# Patient Record
Sex: Male | Born: 1980
Health system: Southern US, Community
[De-identification: ages and names within clinical notes are randomized; demographics above are authoritative.]

## PROBLEM LIST (undated history)

## (undated) DIAGNOSIS — R1310 Dysphagia, unspecified: Secondary | ICD-10-CM

## (undated) DIAGNOSIS — R319 Hematuria, unspecified: Secondary | ICD-10-CM

## (undated) DIAGNOSIS — H02402 Unspecified ptosis of left eyelid: Secondary | ICD-10-CM

## (undated) DIAGNOSIS — H5010 Unspecified exotropia: Secondary | ICD-10-CM

## (undated) DIAGNOSIS — R0602 Shortness of breath: Secondary | ICD-10-CM

## (undated) DIAGNOSIS — I10 Essential (primary) hypertension: Secondary | ICD-10-CM

## (undated) DIAGNOSIS — D75839 Thrombocytosis, unspecified: Secondary | ICD-10-CM

## (undated) DIAGNOSIS — H532 Diplopia: Secondary | ICD-10-CM

## (undated) DIAGNOSIS — G7000 Myasthenia gravis without (acute) exacerbation: Secondary | ICD-10-CM

## (undated) HISTORY — DX: Shortness of breath: R06.02

## (undated) HISTORY — DX: Unspecified exotropia: H50.10

## (undated) HISTORY — DX: Myasthenia gravis without (acute) exacerbation: G70.00

## (undated) HISTORY — DX: Diplopia: H53.2

## (undated) HISTORY — DX: Hematuria, unspecified: R31.9

## (undated) HISTORY — DX: Dysphagia, unspecified: R13.10

## (undated) HISTORY — DX: Unspecified ptosis of left eyelid: H02.402

## (undated) HISTORY — PX: FRACTURE SURGERY: SHX138

## (undated) HISTORY — DX: Essential (primary) hypertension: I10

## (undated) HISTORY — DX: Thrombocytosis, unspecified: D75.839

---

## 2006-09-29 ENCOUNTER — Emergency Department (HOSPITAL_COMMUNITY): Admission: EM | Admit: 2006-09-29 | Discharge: 2006-09-29 | Payer: Self-pay | Admitting: Emergency Medicine

## 2006-09-29 IMAGING — CR DG SHOULDER 2+V*L*
3 series · 3 of 3 positions shown · non-contrast
Comparison: None available.

CLINICAL DATA: Fell yesterday ? posterior shoulder pain. 
 LEFT SHOULDER ? 3 VIEW:

[view not recorded (1 of 3)]
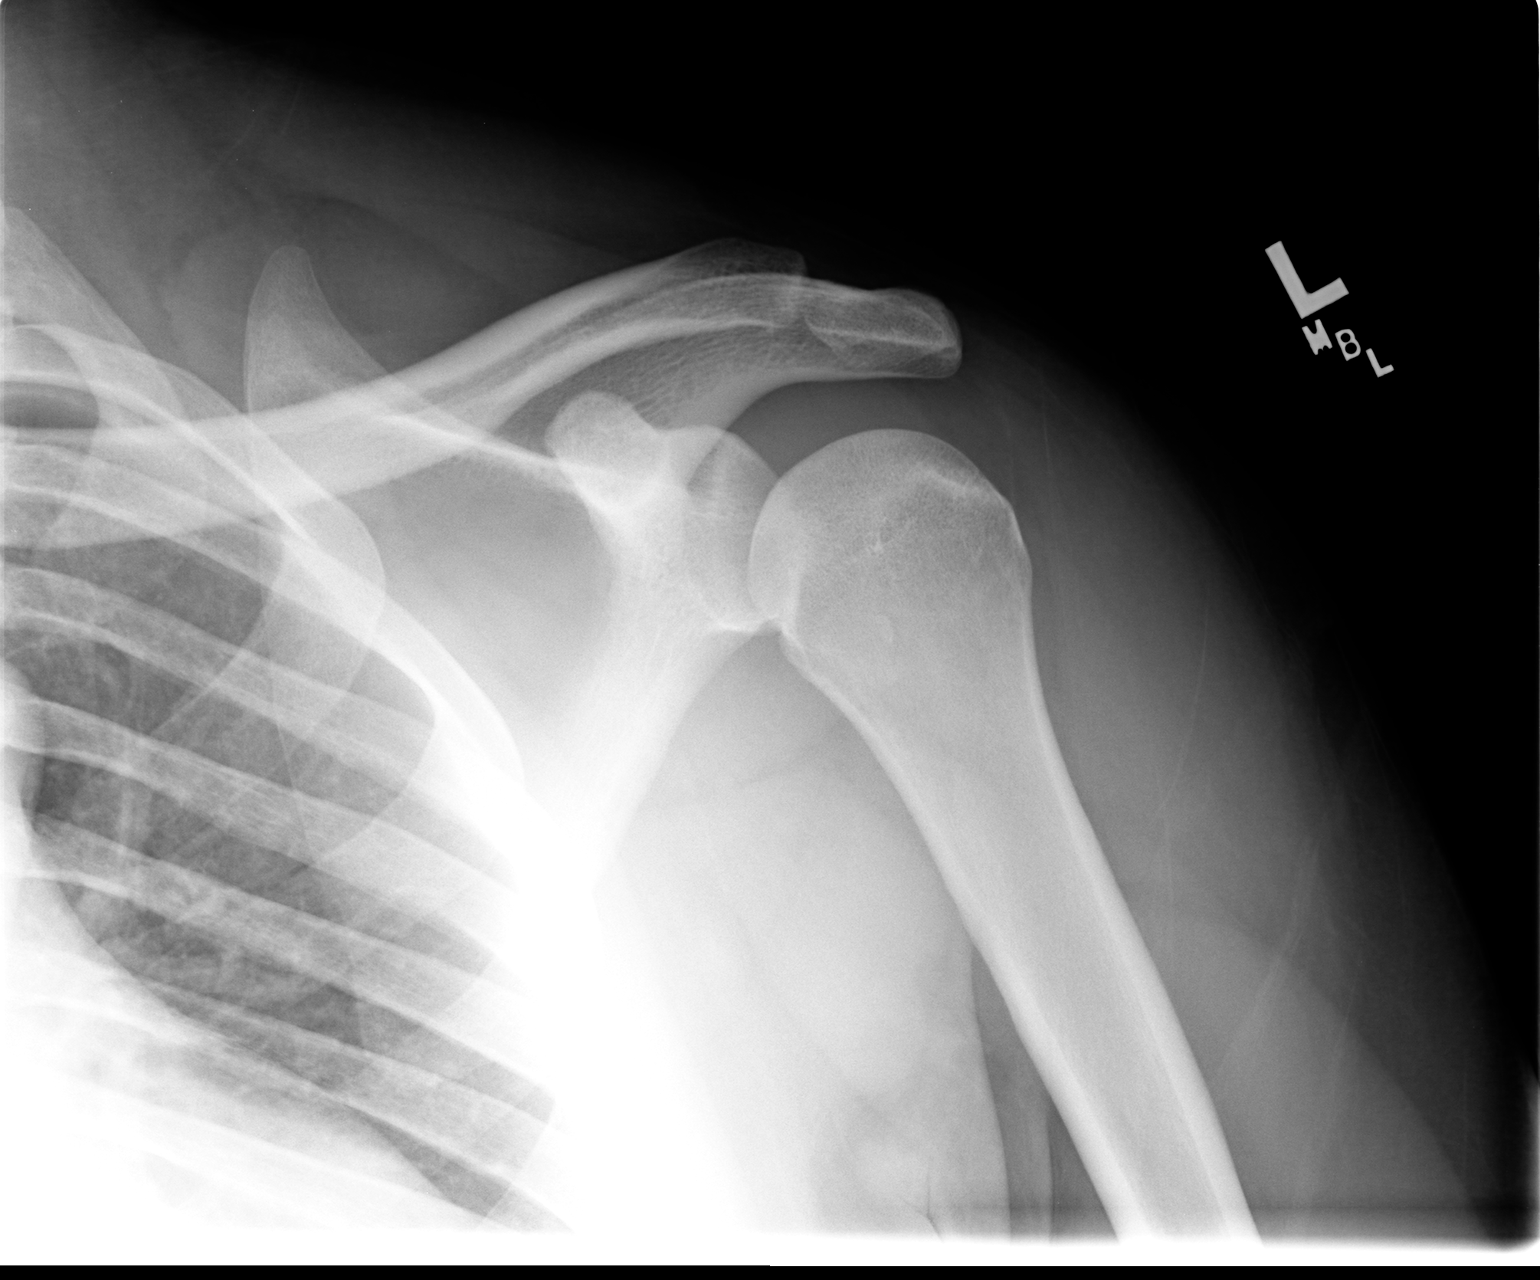

[view not recorded (2 of 3)]
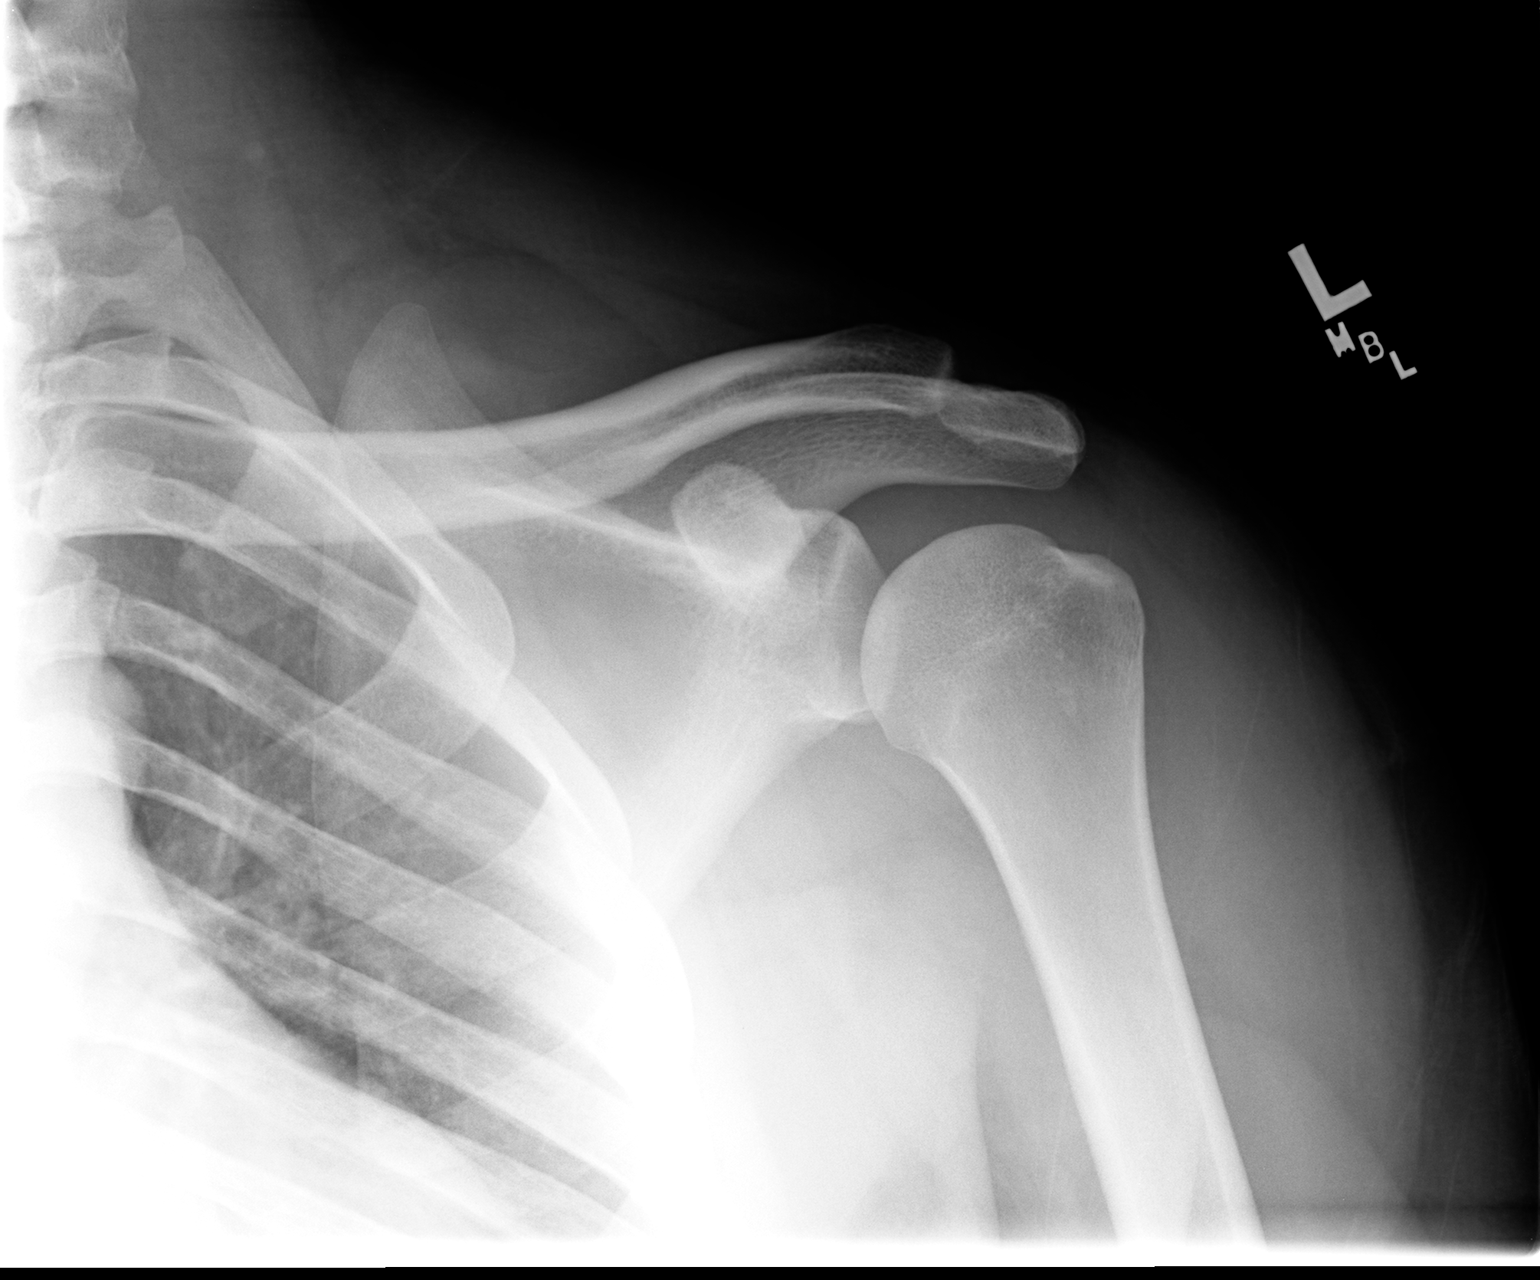

[view not recorded (3 of 3)]
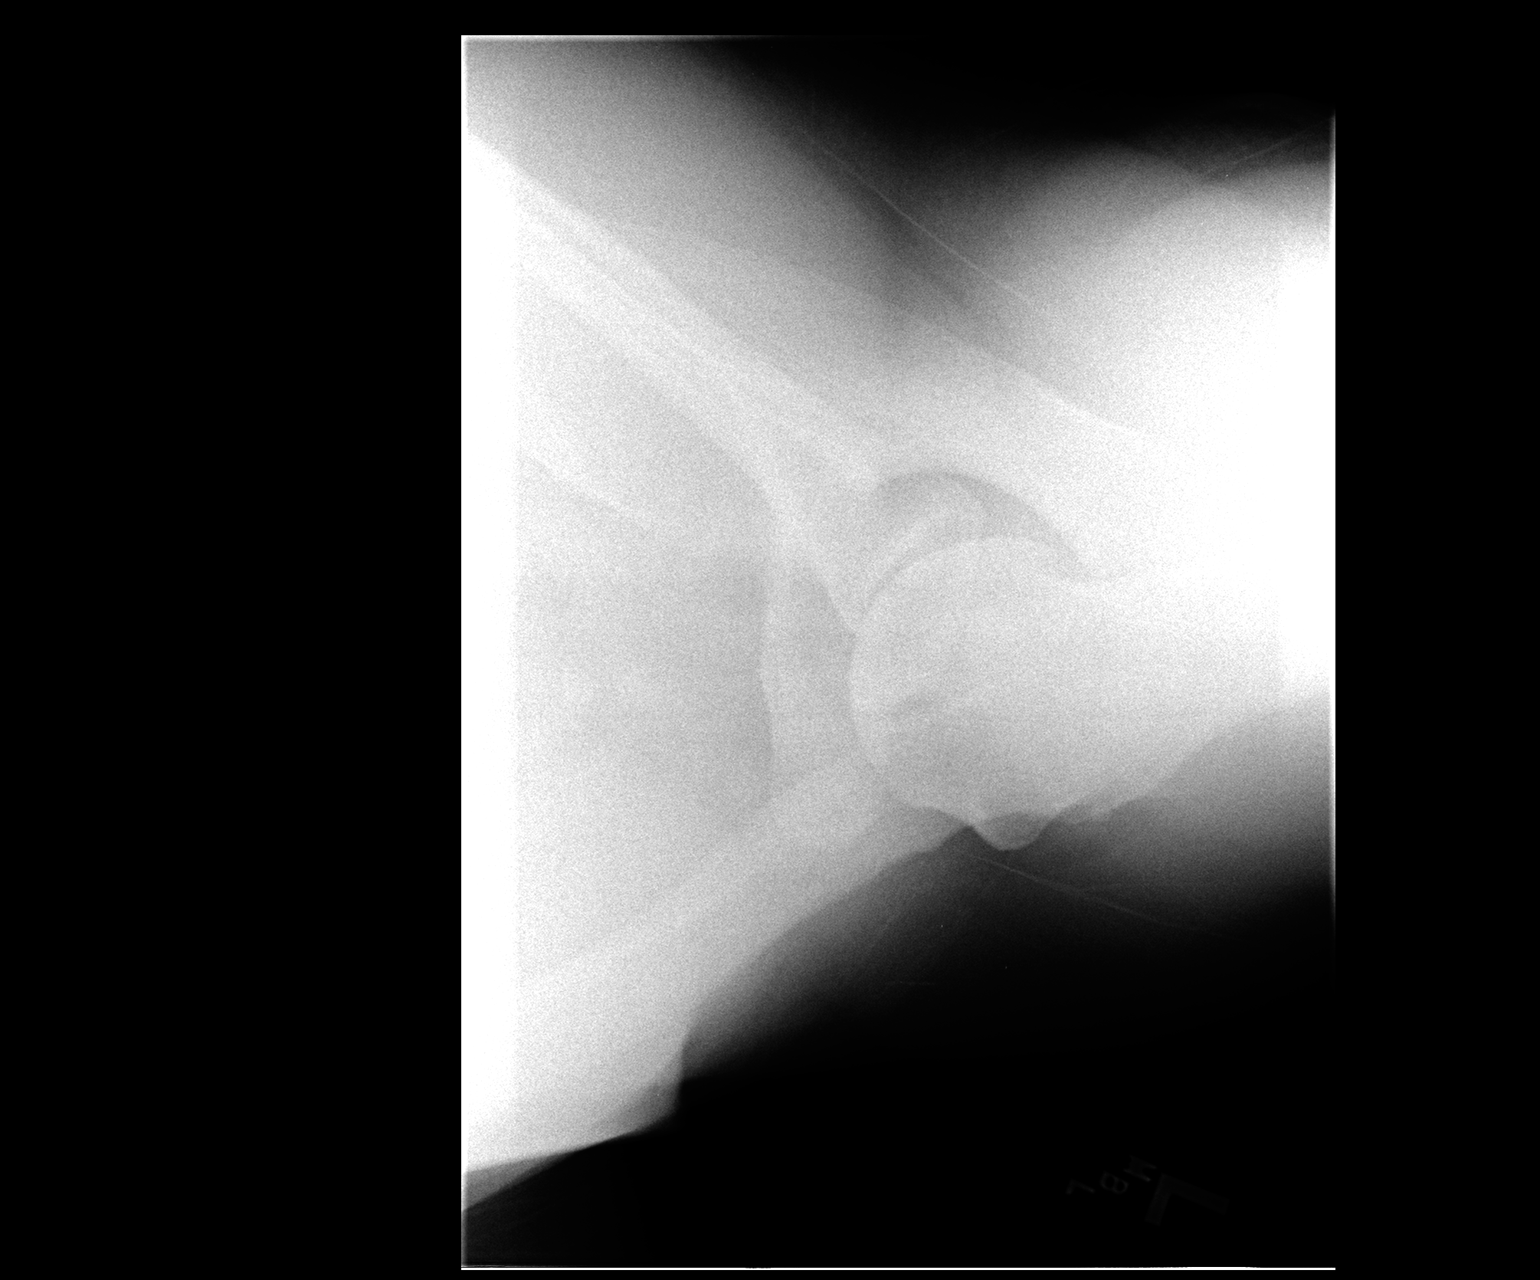

[3 of 3 positions shown; findings below may reference images not displayed]

FINDINGS: There is no definite fracture or dislocation.  However, the left distal clavicle is high relative to the acromion.  A left AC joint separation cannot be excluded.  This needs clinical correlation.  Recommend AC joint series if further evaluation is clinically warranted.
IMPRESSION: Cannot rule out left AC joint separation ? see report.

## 2006-09-29 IMAGING — CR DG HAND COMPLETE 3+V*L*
3 series · 3 of 3 positions shown · non-contrast
Comparison: None.

CLINICAL DATA: Fell - laceration to 3rd MCP joint area. 
 LEFT HAND - 3 VIEW:

[view not recorded (1 of 3)]
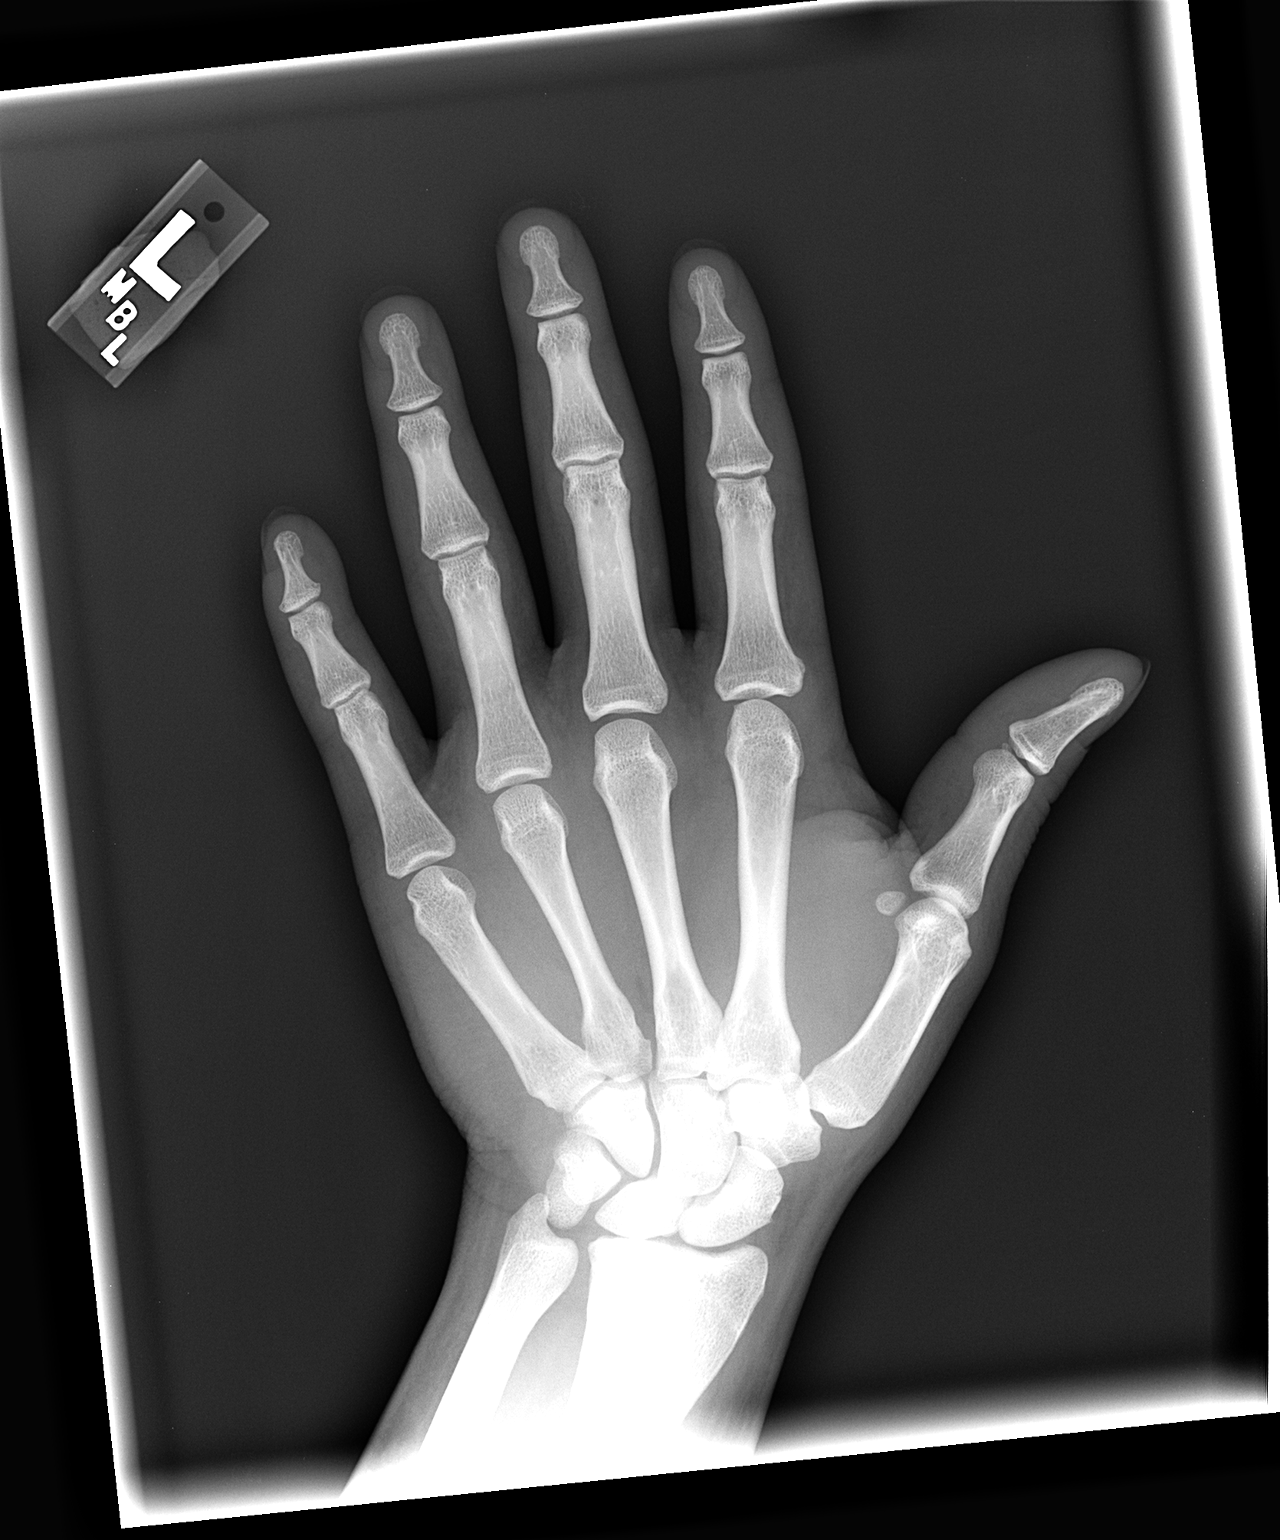

[view not recorded (2 of 3)]
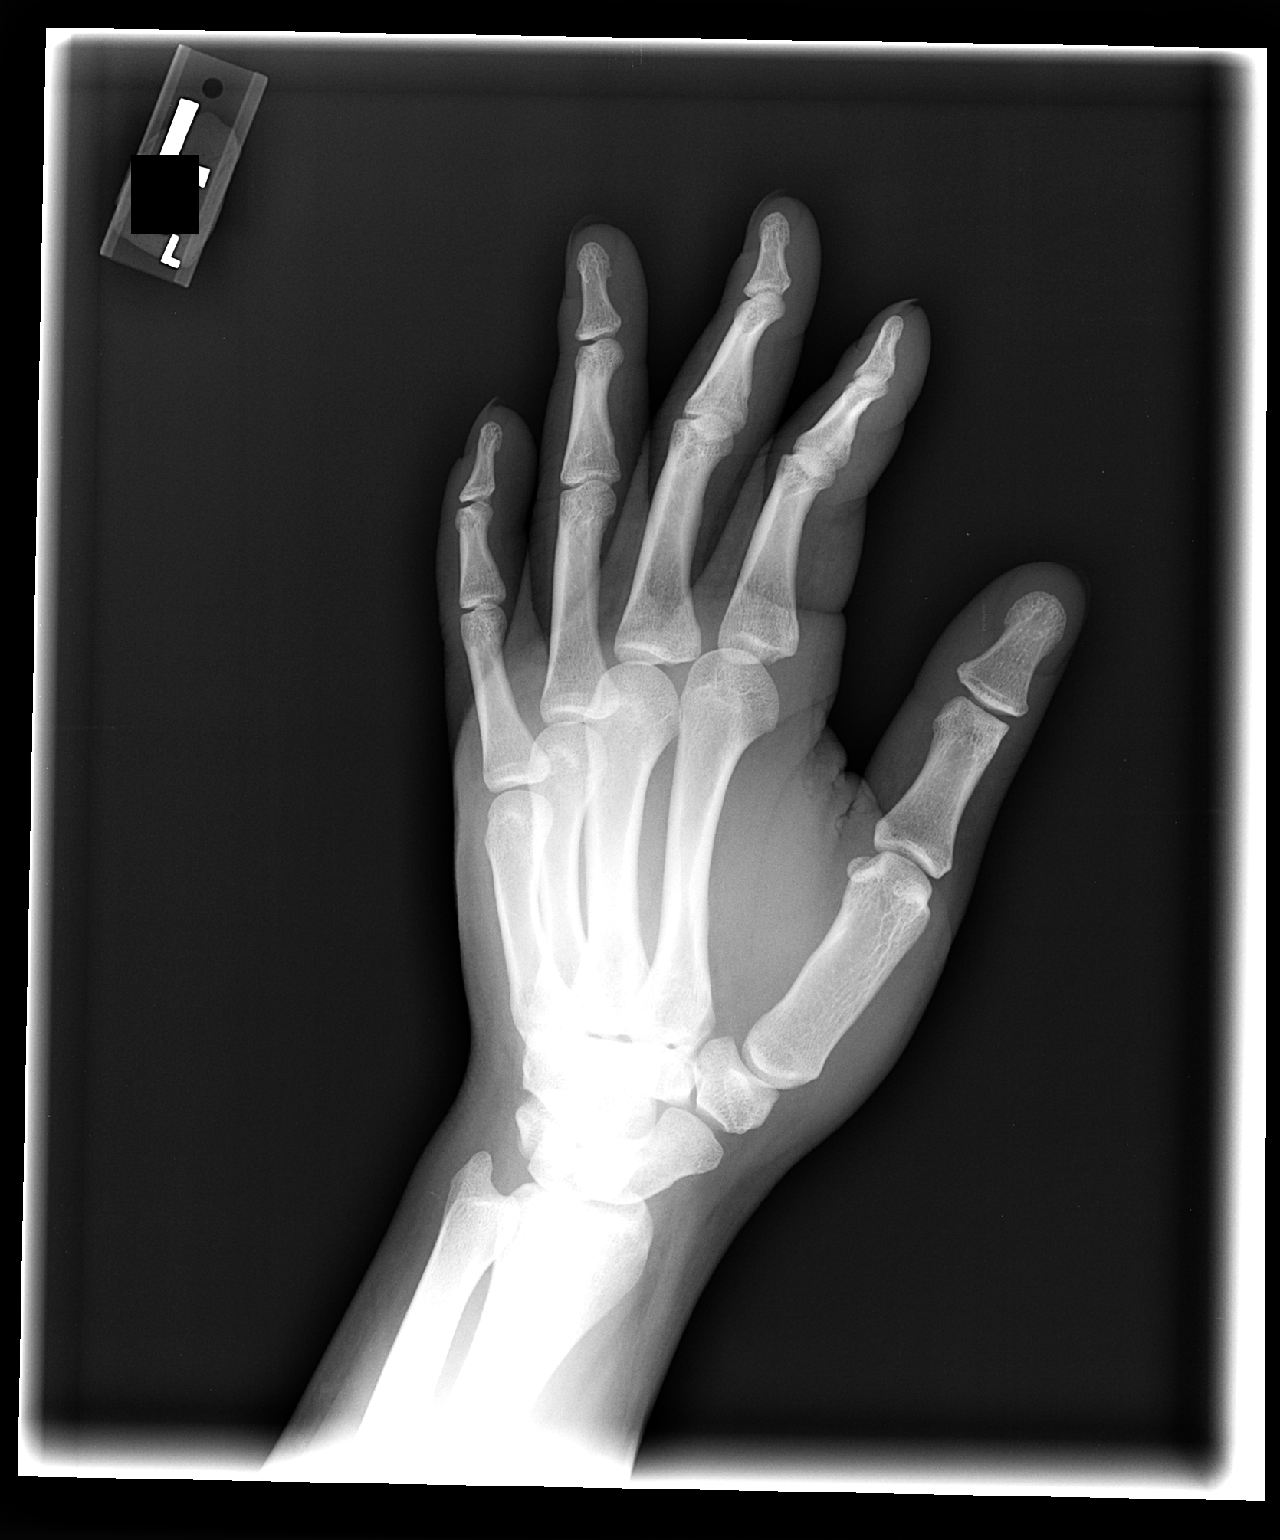

[view not recorded (3 of 3)]
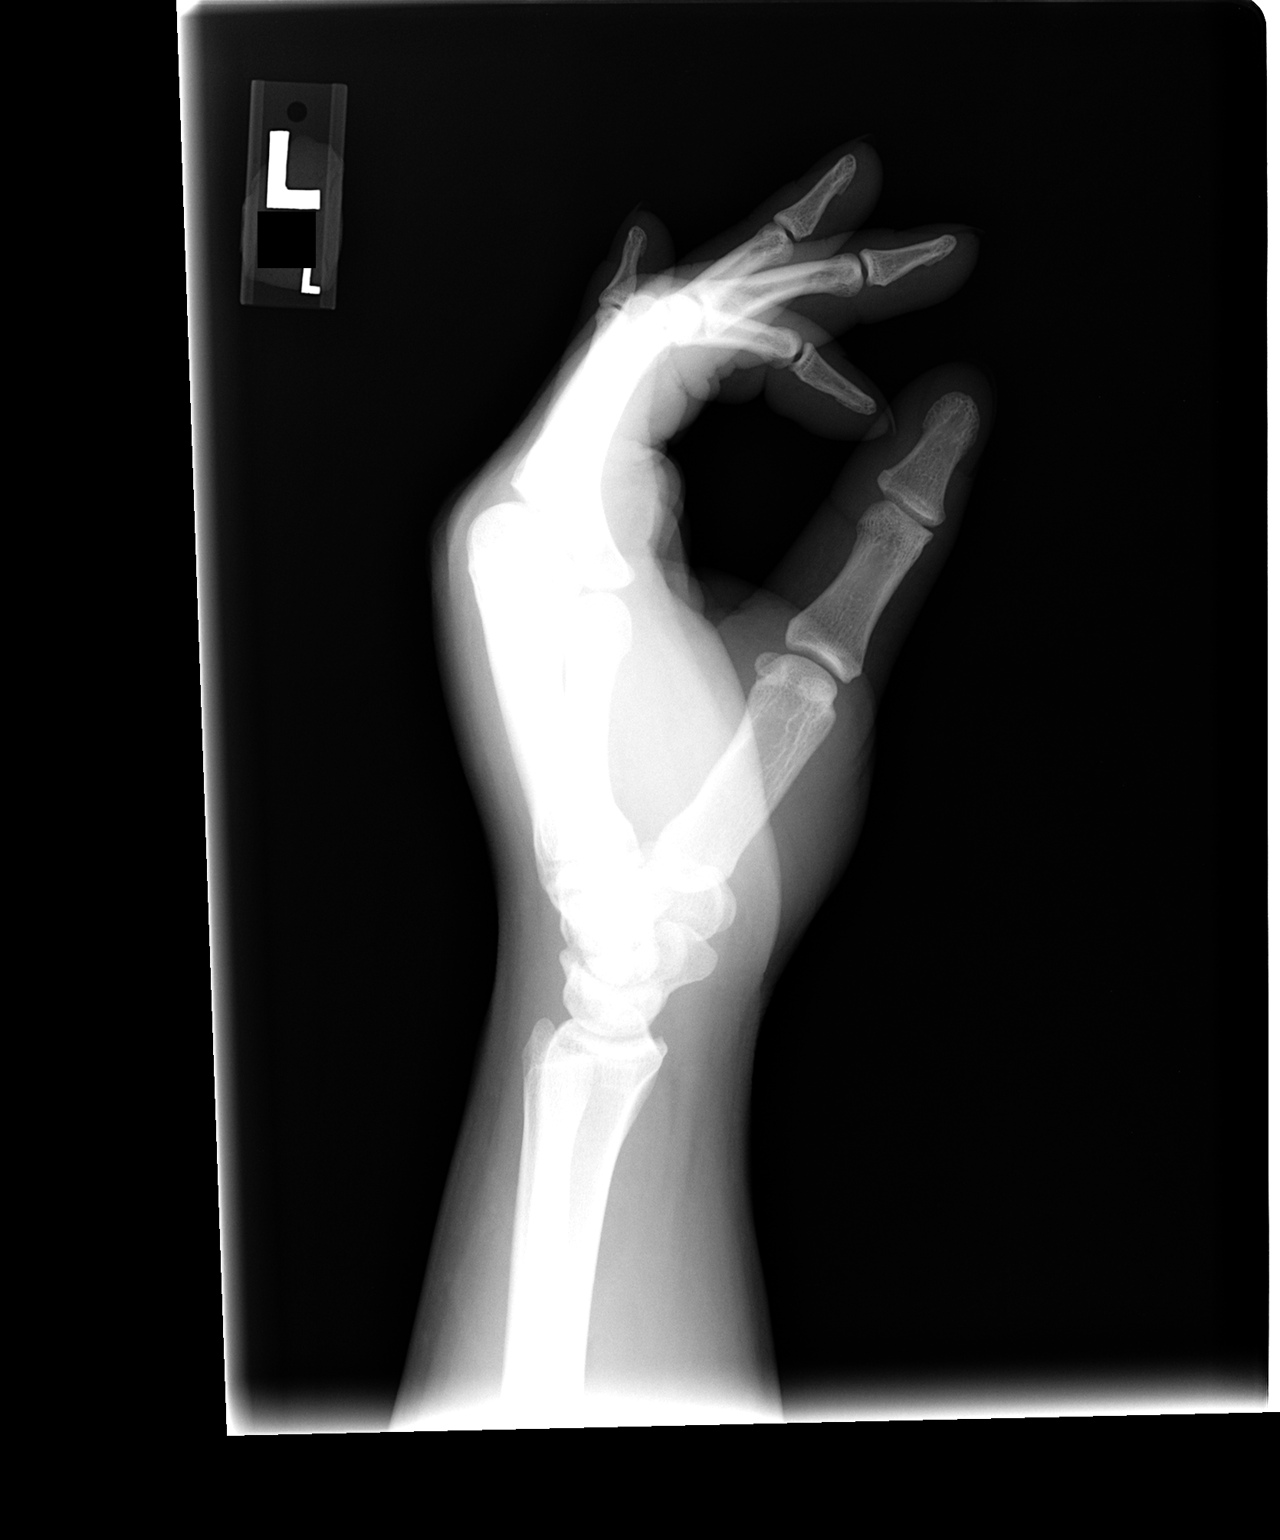

[3 of 3 positions shown; findings below may reference images not displayed]

FINDINGS: There is some soft tissue swelling over the dorsum of the hand.  No fracture, dislocation, or foreign body.
IMPRESSION: No acute or significant findings.

## 2007-08-03 ENCOUNTER — Emergency Department (HOSPITAL_COMMUNITY): Admission: EM | Admit: 2007-08-03 | Discharge: 2007-08-03 | Payer: Self-pay | Admitting: Family Medicine

## 2007-12-05 IMAGING — CR DG SACRUM/COCCYX 2+V
2 series · 2 of 2 positions shown · non-contrast
Comparison: None

CLINICAL DATA: Fall.  Sacral pain.

SACRUM AND COCCYX - 2+ VIEW

[t sacrum a.p.]
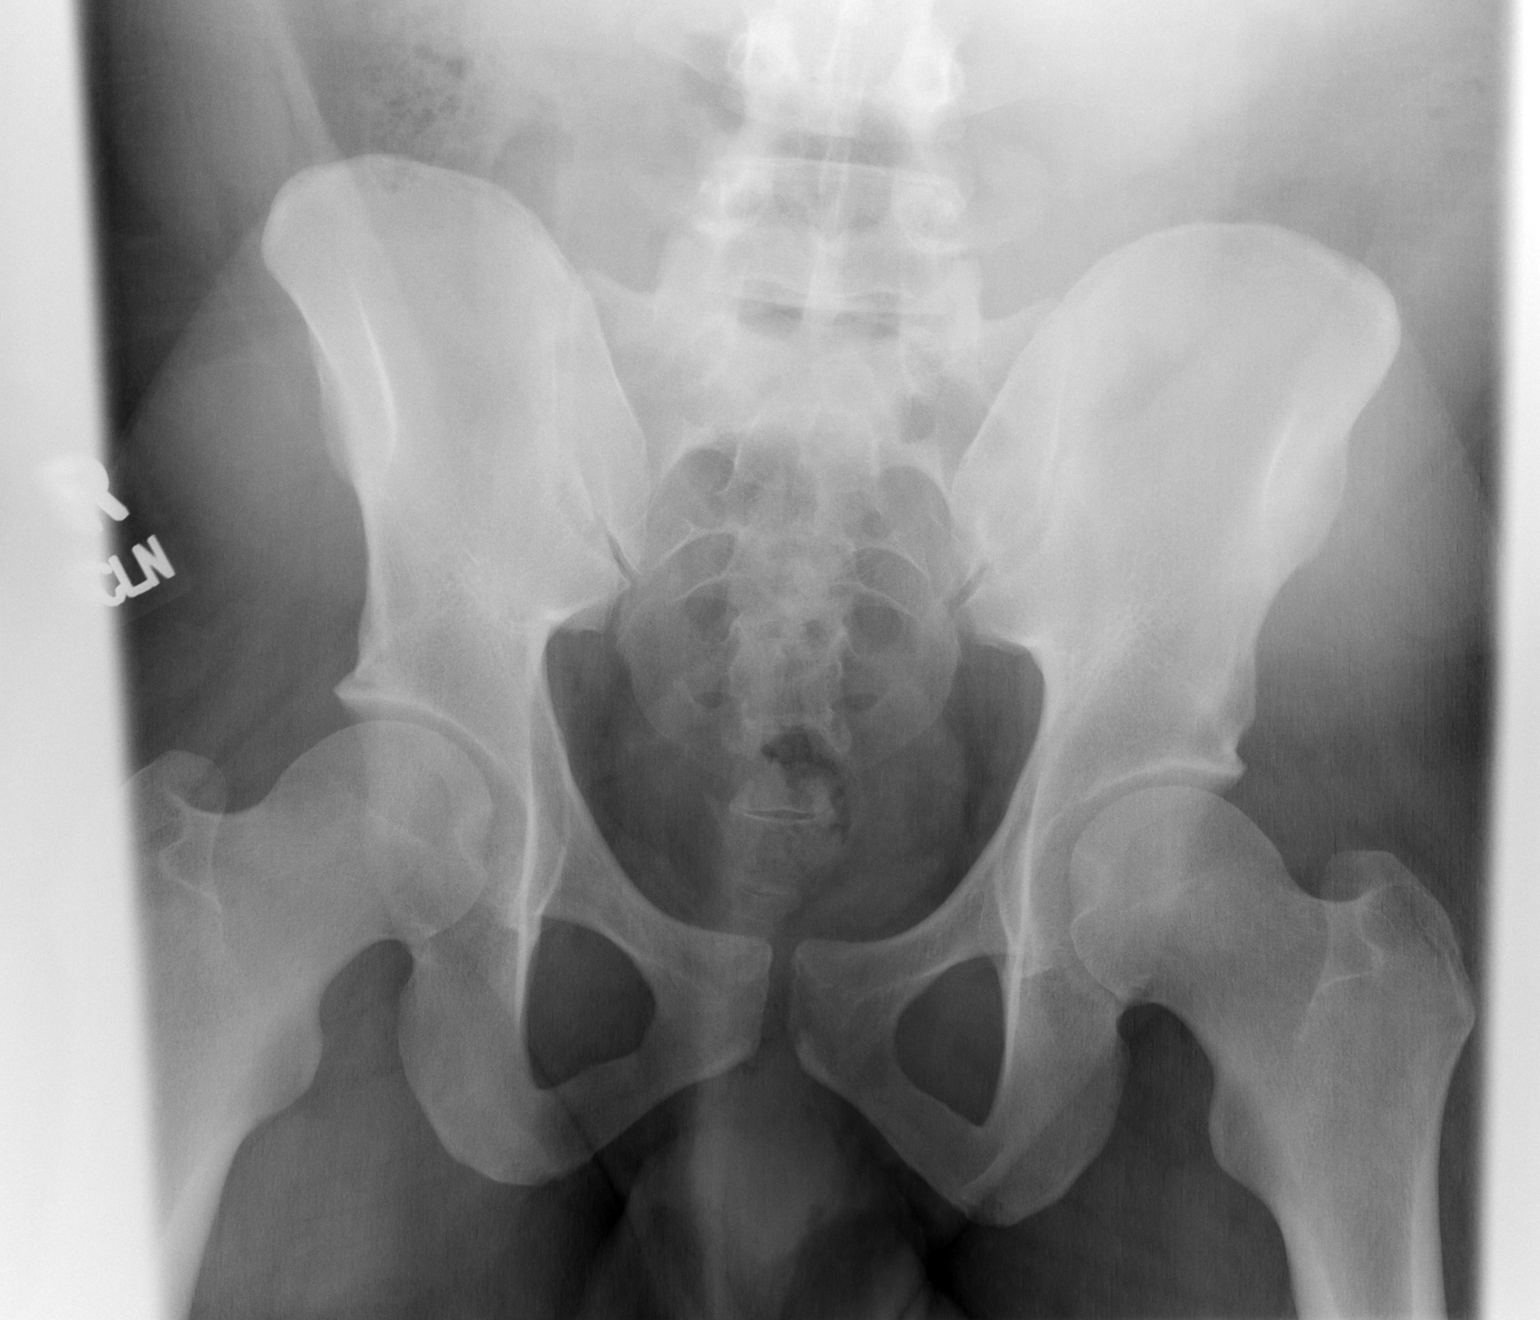

[t sacrum lat]
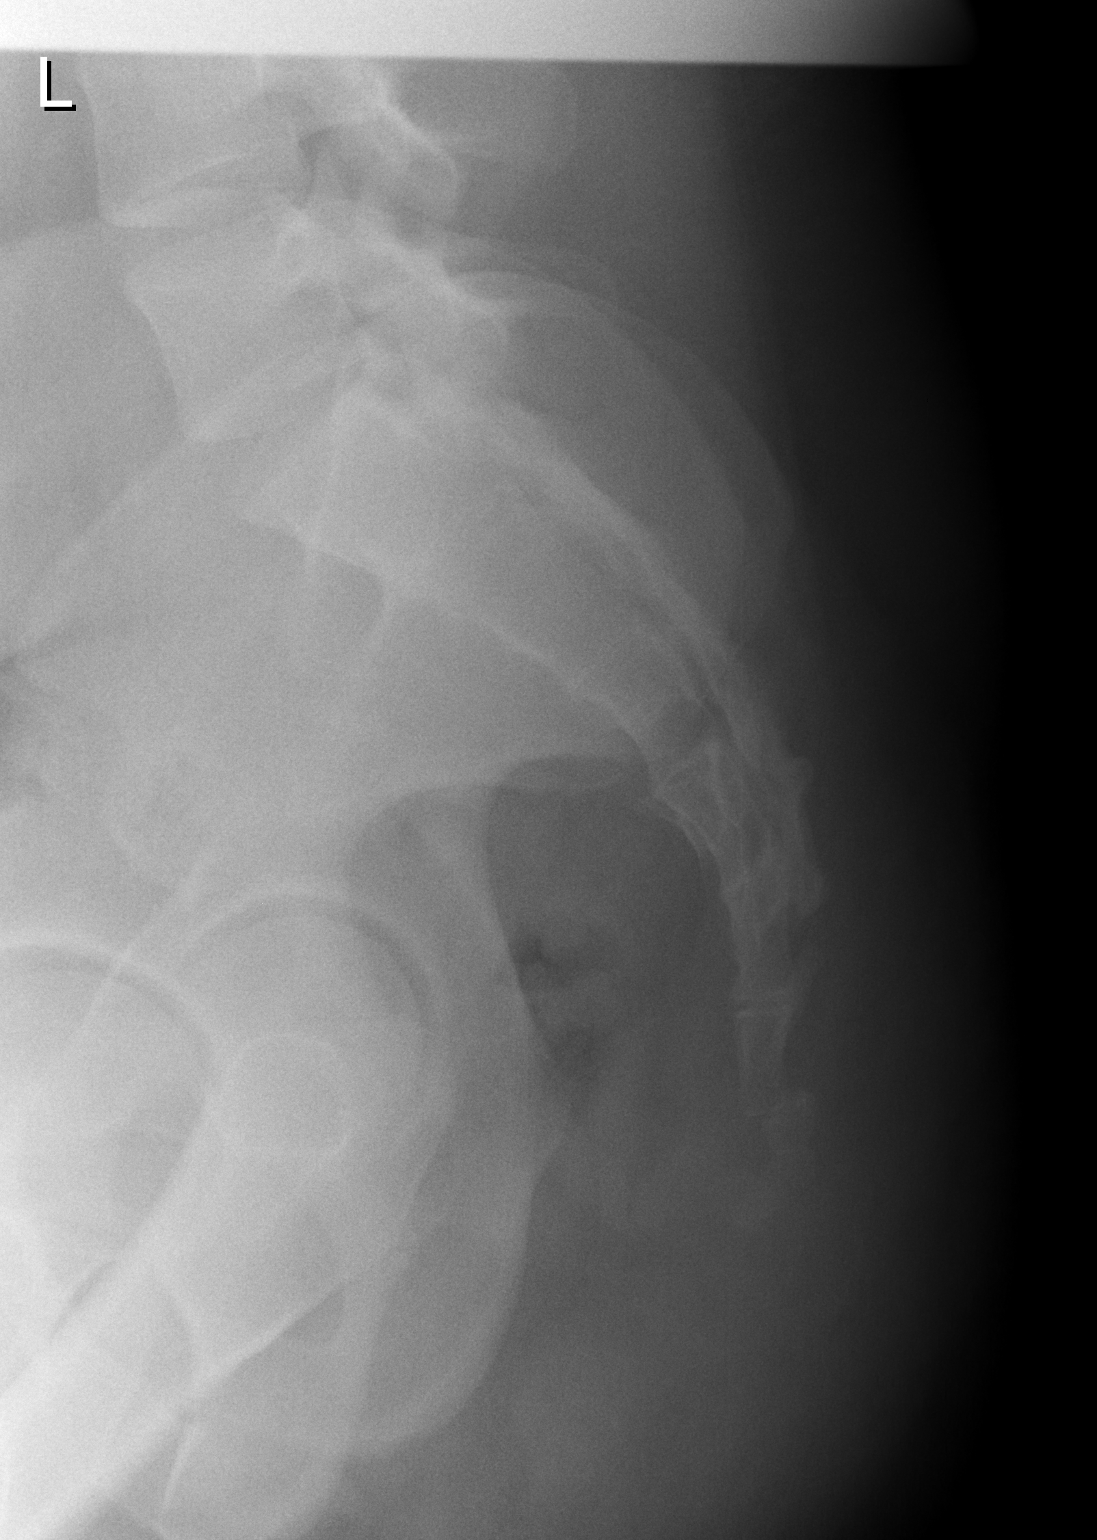

[2 of 2 positions shown; findings below may reference images not displayed]

FINDINGS: There is a coccygeal fracture.  The last three coccygeal
segments are displaced dorsally.  No fracture of the sacrum is
identified.
IMPRESSION: Dorsal displacement of the last three coccygeal segments.

## 2008-04-20 ENCOUNTER — Emergency Department (HOSPITAL_COMMUNITY): Admission: EM | Admit: 2008-04-20 | Discharge: 2008-04-20 | Payer: Self-pay | Admitting: Emergency Medicine

## 2010-06-01 ENCOUNTER — Inpatient Hospital Stay (INDEPENDENT_AMBULATORY_CARE_PROVIDER_SITE_OTHER)
Admission: RE | Admit: 2010-06-01 | Discharge: 2010-06-01 | Disposition: A | Discharge status: Home / Self Care | Payer: Self-pay | Source: Ambulatory Visit | Attending: Emergency Medicine | Admitting: Emergency Medicine

## 2010-06-01 DIAGNOSIS — R6889 Other general symptoms and signs: Secondary | ICD-10-CM

## 2010-06-01 DIAGNOSIS — Z0489 Encounter for examination and observation for other specified reasons: Secondary | ICD-10-CM

## 2010-06-01 DIAGNOSIS — E860 Dehydration: Secondary | ICD-10-CM

## 2010-06-01 LAB — URINALYSIS, MICROSCOPIC ONLY
Glucose, UA: NEGATIVE mg/dL
Ketones, ur: NEGATIVE mg/dL
pH: 6.5 (ref 5.0–8.0)

## 2010-06-01 LAB — POCT URINALYSIS DIP (DEVICE)
Bilirubin Urine: NEGATIVE
Ketones, ur: NEGATIVE mg/dL
Specific Gravity, Urine: 1.015 (ref 1.005–1.030)
pH: 6.5 (ref 5.0–8.0)

## 2010-06-01 LAB — POCT I-STAT, CHEM 8
BUN: 14 mg/dL (ref 6–23)
Calcium, Ion: 1.25 mmol/L (ref 1.12–1.32)
Chloride: 103 mEq/L (ref 96–112)
Creatinine, Ser: 1.2 mg/dL (ref 0.4–1.5)

## 2010-06-02 LAB — URINE CULTURE

## 2010-09-10 ENCOUNTER — Inpatient Hospital Stay (INDEPENDENT_AMBULATORY_CARE_PROVIDER_SITE_OTHER)
Admission: RE | Admit: 2010-09-10 | Discharge: 2010-09-10 | Disposition: A | Discharge status: Home / Self Care | Payer: Self-pay | Source: Ambulatory Visit | Attending: Emergency Medicine | Admitting: Emergency Medicine

## 2010-09-10 DIAGNOSIS — K029 Dental caries, unspecified: Secondary | ICD-10-CM

## 2010-09-10 DIAGNOSIS — K089 Disorder of teeth and supporting structures, unspecified: Secondary | ICD-10-CM

## 2010-12-09 LAB — POCT RAPID STREP A: Streptococcus, Group A Screen (Direct): NEGATIVE

## 2012-04-18 ENCOUNTER — Encounter (HOSPITAL_COMMUNITY): Payer: Self-pay

## 2012-04-18 ENCOUNTER — Emergency Department (HOSPITAL_COMMUNITY)
Admission: EM | Admit: 2012-04-18 | Discharge: 2012-04-18 | Disposition: A | Discharge status: Home / Self Care | Payer: Self-pay | Attending: Emergency Medicine | Admitting: Emergency Medicine

## 2012-04-18 DIAGNOSIS — F17200 Nicotine dependence, unspecified, uncomplicated: Secondary | ICD-10-CM | POA: Insufficient documentation

## 2012-04-18 DIAGNOSIS — R197 Diarrhea, unspecified: Secondary | ICD-10-CM | POA: Insufficient documentation

## 2012-04-18 DIAGNOSIS — R52 Pain, unspecified: Secondary | ICD-10-CM | POA: Insufficient documentation

## 2012-04-18 DIAGNOSIS — K529 Noninfective gastroenteritis and colitis, unspecified: Secondary | ICD-10-CM

## 2012-04-18 DIAGNOSIS — K5289 Other specified noninfective gastroenteritis and colitis: Secondary | ICD-10-CM | POA: Insufficient documentation

## 2012-04-18 LAB — CBC WITH DIFFERENTIAL/PLATELET
Basophils Relative: 0 % (ref 0–1)
Eosinophils Absolute: 0.3 10*3/uL (ref 0.0–0.7)
Eosinophils Relative: 3 % (ref 0–5)
Lymphs Abs: 0.5 10*3/uL — ABNORMAL LOW (ref 0.7–4.0)
MCH: 30.4 pg (ref 26.0–34.0)
MCHC: 35.3 g/dL (ref 30.0–36.0)
MCV: 86.2 fL (ref 78.0–100.0)
Neutrophils Relative %: 81 % — ABNORMAL HIGH (ref 43–77)
Platelets: 554 10*3/uL — ABNORMAL HIGH (ref 150–400)
RBC: 5.72 MIL/uL (ref 4.22–5.81)
RDW: 13.9 % (ref 11.5–15.5)

## 2012-04-18 LAB — BASIC METABOLIC PANEL
Calcium: 9.2 mg/dL (ref 8.4–10.5)
Chloride: 104 mEq/L (ref 96–112)
Creatinine, Ser: 1.01 mg/dL (ref 0.50–1.35)
GFR calc non Af Amer: >90 mL/min (ref >90)
Potassium: 3.8 mEq/L (ref 3.5–5.1)

## 2012-04-18 MED ORDER — ONDANSETRON HCL 4 MG/2ML IJ SOLN
4.0000 mg | Freq: Once | INTRAMUSCULAR | Status: AC
  Administered 2012-04-18: 4 mg via INTRAVENOUS
  Filled 2012-04-18: qty 2

## 2012-04-18 MED ORDER — SODIUM CHLORIDE 0.9 % IV BOLUS (SEPSIS)
1000.0000 mL | Freq: Once | INTRAVENOUS | Status: AC
  Administered 2012-04-18: 1000 mL via INTRAVENOUS

## 2012-04-18 MED ORDER — PROMETHAZINE HCL 25 MG PO TABS: 25.0000 mg | ORAL_TABLET | Freq: Four times a day (QID) | ORAL | Status: AC | PRN

## 2012-04-18 NOTE — ED Notes (Signed)
Pt given PO fluid per EDP order

## 2012-04-18 NOTE — ED Notes (Signed)
Pt tolerated PO fluid well. 

## 2012-04-18 NOTE — ED Provider Notes (Signed)
History     CSN: [id number]  Arrival date & time 04/18/12  4098   First MD Initiated Contact with Patient 04/18/12 [phone]      Chief Complaint  Patient presents with  . Emesis     HPI 24-48 hour history of nausea vomiting diarrhea.  Patient also complains of bodyaches.  Has no previous medical history is on no current regular medications.  Denies significant abdominal pain.  Denies blood in his diarrhea or emesis. History reviewed. No pertinent past medical history.  Past Surgical History  Procedure Date  . Fracture surgery     No family history on file.  History  Substance Use Topics  . Smoking status: Current Every Day Smoker    Types: Cigarettes  . Smokeless tobacco: Not on file  . Alcohol Use: Yes     Comment: occasional      Review of Systems All other systems reviewed and are negative Allergies  Review of patient's allergies indicates no known allergies.  Home Medications   Current Outpatient Rx  Name  Route  Sig  Dispense  Refill  . OVER THE COUNTER MEDICATION   Oral   Take 1 tablet by mouth daily. OTC Weight loss pill         . PROMETHAZINE HCL 25 MG PO TABS   Oral   Take 1 tablet (25 mg total) by mouth every 6 (six) hours as needed for nausea.   30 tablet   0     BP 149/87  Pulse 109  Temp 98.9 F (37.2 C) (Oral)  Resp 20  SpO2 95%  Physical Exam  Nursing note and vitals reviewed. Constitutional: He is oriented to person, place, and time. He appears well-developed and well-nourished. No distress.  HENT:  Head: Normocephalic and atraumatic.  Mouth/Throat: Mucous membranes are not dry.  Eyes: Pupils are equal, round, and reactive to light.  Neck: Normal range of motion.  Cardiovascular: Normal rate and intact distal pulses.   Pulmonary/Chest: No respiratory distress.  Abdominal: Normal appearance. He exhibits no distension. There is no tenderness. There is no rebound.  Musculoskeletal: Normal range of motion.  Neurological: He is alert  and oriented to person, place, and time. No cranial nerve deficit.  Skin: Skin is warm and dry. No rash noted.  Psychiatric: He has a normal mood and affect. His behavior is normal.    ED Course  Procedures (including critical care time) Meds ordered this encounter  Medications  . OVER THE COUNTER MEDICATION    Sig: Take 1 tablet by mouth daily. OTC Weight loss pill  . sodium chloride 0.9 % bolus 1,000 mL    Sig:   . ondansetron (ZOFRAN) injection 4 mg    Sig:   . promethazine (PHENERGAN) 25 MG tablet    Sig: Take 1 tablet (25 mg total) by mouth every 6 (six) hours as needed for nausea.    Dispense:  30 tablet    Refill:  0    Labs Reviewed  BASIC METABOLIC PANEL - Abnormal; Notable for the following:    Glucose, Bld 100 (*)     All other components within normal limits  CBC WITH DIFFERENTIAL - Abnormal; Notable for the following:    Hemoglobin 17.4 (*)     Platelets 554 (*)     Neutrophils Relative 81 (*)     Lymphocytes Relative 6 (*)     Lymphs Abs 0.5 (*)     All other components within normal limits  No results found.   1. Gastroenteritis       MDM   After treatment in the ED the patient feels back to baseline and wants to go home.       Nelia Shi, MD 04/18/12 [phone]

## 2012-04-18 NOTE — ED Notes (Signed)
Started yesterday, n/v/d had it all night. Body aches

## 2016-07-26 ENCOUNTER — Ambulatory Visit (HOSPITAL_COMMUNITY)
Admission: EM | Admit: 2016-07-26 | Discharge: 2016-07-26 | Disposition: A | Discharge status: Home / Self Care | Payer: Self-pay | Attending: Internal Medicine | Admitting: Internal Medicine

## 2016-07-26 ENCOUNTER — Encounter (HOSPITAL_COMMUNITY): Payer: Self-pay | Admitting: Emergency Medicine

## 2016-07-26 DIAGNOSIS — K429 Umbilical hernia without obstruction or gangrene: Secondary | ICD-10-CM

## 2016-07-26 DIAGNOSIS — K644 Residual hemorrhoidal skin tags: Secondary | ICD-10-CM

## 2016-07-26 MED ORDER — HYDROCORTISONE ACE-PRAMOXINE 2.5-1 % RE CREA: 1.0000 "application " | TOPICAL_CREAM | Freq: Three times a day (TID) | RECTAL | 0 refills | Status: AC

## 2016-07-26 MED ORDER — NAPROXEN 375 MG PO TABS: 375.0000 mg | ORAL_TABLET | Freq: Two times a day (BID) | ORAL | 0 refills | Status: DC

## 2016-07-26 MED ORDER — TRAMADOL HCL 50 MG PO TABS: 50.0000 mg | ORAL_TABLET | Freq: Four times a day (QID) | ORAL | 0 refills | Status: AC | PRN

## 2016-07-26 NOTE — ED Provider Notes (Signed)
CSN: [id number]     Arrival date & time 07/26/16  1016 History   First MD Initiated Contact with Patient 07/26/16 1152     Chief Complaint  Patient presents with  . Hemorrhoids   (Consider location/radiation/quality/duration/timing/severity/associated sxs/prior Treatment) 36 year old male complaining of rectal pain for 2 months off and on. The past 2 days the pain has become more severe and persistent. He states he believes he may have a hemorrhoid although he does not have a history of hemorrhoids. His had no bleeding. Pain is worse with sitting, straining, weight lifting and with some bowel movements.  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  His second complaint is that of a umbilical hernia that he is had for years. He has recently lost weight and now is more prominent. Currently no pain. It is small and easily reduced.      History reviewed. No pertinent past medical history. Past Surgical History:  Procedure Laterality Date  . FRACTURE SURGERY     No family history on file. Social History  Substance Use Topics  . Smoking status: Current Every Day Smoker    Types: Cigarettes  . Smokeless tobacco: Not on file  . Alcohol use Yes     Comment: occasional    Review of Systems  Constitutional: Positive for activity change. Negative for fever.  HENT: Negative.   Respiratory: Negative.   Gastrointestinal: Positive for rectal pain. Negative for abdominal pain, anal bleeding, blood in stool, diarrhea, nausea and vomiting.  Genitourinary: Negative.   Musculoskeletal: Negative.   Neurological: Negative.   All other systems reviewed and are negative.   Allergies  Patient has no known allergies.  Home Medications   Prior to Admission medications   Medication Sig Start Date End Date Taking? Authorizing Provider  Omega-3 Fatty Acids (FISH OIL) 1000 MG CAPS Take by mouth.   Yes [provider]   hydrocortisone-pramoxine (ANALPRAM HC) 2.5-1 % rectal cream Place 1 application rectally 3 (three) times daily. 07/26/16   Janne Napoleon, NP  naproxen (NAPROSYN) 375 MG tablet Take 1 tablet (375 mg total) by mouth 2 (two) times daily. 07/26/16   Janne Napoleon, NP  OVER THE COUNTER MEDICATION Take 1 tablet by mouth daily. OTC Weight loss pill    [provider]  promethazine (PHENERGAN) 25 MG tablet Take 1 tablet (25 mg total) by mouth every 6 (six) hours as needed for nausea. 04/18/12   Leonard Schwartz, MD  traMADol (ULTRAM) 50 MG tablet Take 1 tablet (50 mg total) by mouth every 6 (six) hours as needed. 07/26/16   Janne Napoleon, NP   Meds Ordered and Administered this Visit  Medications - No data to display  BP 138/82 (BP Location: Left Arm)   Pulse 77   Temp 99 F (37.2 C) (Oral)   Resp 16   SpO2 99%  No data found.   Physical Exam  Constitutional: He is oriented to person, place, and time. He appears well-developed and well-nourished. No distress.  Neck: Neck supple.  Cardiovascular: Normal rate, regular rhythm and normal heart sounds.   Pulmonary/Chest: Effort normal and breath sounds normal. No respiratory distress.  Abdominal: Soft. Bowel sounds are normal.  There is an umbilical extrusion. Soft. Small and easily reducible. No tenderness. No overlying discoloration.  Genitourinary:  Genitourinary Comments: Rectal exam shows an external hemorrhoid which is tender. No discoloration. It is relatively flat and hard. This is the source of  his rectal pain. No swelling or other lesions recognized.  Musculoskeletal: Normal range of motion.  Neurological: He is alert and oriented to person, place, and time.  Skin: Skin is warm and dry.  Nursing note and vitals reviewed.   Urgent Care Course     Procedures (including critical care time)  Labs Review Labs Reviewed - No data to display  Imaging Review No results found.   Visual Acuity Review  Right Eye Distance:   Left Eye  Distance:   Bilateral Distance:    Right Eye Near:   Left Eye Near:    Bilateral Near:         MDM   1. Umbilical hernia without obstruction and without gangrene   2. External hemorrhoid    Straining such as lifting whether it be at work or weights can increase the chances of hemorrhoids and exacerbation of hemorrhoids. If you are going to lift recommend wearing a belt around your mid abdomen to prevent further injury of the hernia. Apply the cream as directed to the hemorrhoid. Warm sitz baths. Take medication as directed. Follow-up with the on-call surgeon. He hemorrhoid or evaluation of your hernia as needed. For any pain, worsening, nausea vomiting, fevers seek medical attention promptly. Meds ordered this encounter  Medications  . Omega-3 Fatty Acids (FISH OIL) 1000 MG CAPS    Sig: Take by mouth.  . hydrocortisone-pramoxine (ANALPRAM HC) 2.5-1 % rectal cream    Sig: Place 1 application rectally 3 (three) times daily.    Dispense:  30 g    Refill:  0    Order Specific Question:   Supervising Provider    Answer:   Sherlene Shams [[id number]]  . naproxen (NAPROSYN) 375 MG tablet    Sig: Take 1 tablet (375 mg total) by mouth 2 (two) times daily.    Dispense:  20 tablet    Refill:  0    Order Specific Question:   Supervising Provider    Answer:   Sherlene Shams [[id number]]  . traMADol (ULTRAM) 50 MG tablet    Sig: Take 1 tablet (50 mg total) by mouth every 6 (six) hours as needed.    Dispense:  15 tablet    Refill:  0    Order Specific Question:   Supervising Provider    Answer:   Sherlene Shams [[id number]]       Janne Napoleon, NP 07/26/16 1227

## 2016-07-26 NOTE — ED Triage Notes (Signed)
Patient is having rectal pain, concerned for hemorrhoids.  Symptoms for 2 months.  Pain is significant for the past 2 days.

## 2016-07-26 NOTE — Discharge Instructions (Signed)
Straining such as lifting whether it be at work or weights can increase the chances of hemorrhoids and exacerbation of hemorrhoids. If you are going to lift recommend wearing a belt around your mid abdomen to prevent further injury of the hernia. Apply the cream as directed to the hemorrhoid. Warm sitz baths. Take medication as directed. Follow-up with the on-call surgeon. He hemorrhoid or evaluation of your hernia as needed. For any pain, worsening, nausea vomiting, fevers seek medical attention promptly.

## 2019-01-22 ENCOUNTER — Other Ambulatory Visit: Payer: Self-pay

## 2019-01-22 ENCOUNTER — Encounter (HOSPITAL_BASED_OUTPATIENT_CLINIC_OR_DEPARTMENT_OTHER): Payer: Self-pay | Admitting: Emergency Medicine

## 2019-01-22 ENCOUNTER — Emergency Department (HOSPITAL_BASED_OUTPATIENT_CLINIC_OR_DEPARTMENT_OTHER)
Admission: EM | Admit: 2019-01-22 | Discharge: 2019-01-22 | Disposition: A | Discharge status: Home / Self Care | Payer: Self-pay | Attending: Emergency Medicine | Admitting: Emergency Medicine

## 2019-01-22 ENCOUNTER — Emergency Department (HOSPITAL_BASED_OUTPATIENT_CLINIC_OR_DEPARTMENT_OTHER): Payer: Self-pay

## 2019-01-22 DIAGNOSIS — R6889 Other general symptoms and signs: Secondary | ICD-10-CM

## 2019-01-22 DIAGNOSIS — U071 COVID-19: Secondary | ICD-10-CM | POA: Insufficient documentation

## 2019-01-22 DIAGNOSIS — R519 Headache, unspecified: Secondary | ICD-10-CM | POA: Insufficient documentation

## 2019-01-22 DIAGNOSIS — R430 Anosmia: Secondary | ICD-10-CM | POA: Insufficient documentation

## 2019-01-22 DIAGNOSIS — F17210 Nicotine dependence, cigarettes, uncomplicated: Secondary | ICD-10-CM | POA: Insufficient documentation

## 2019-01-22 DIAGNOSIS — R05 Cough: Secondary | ICD-10-CM | POA: Insufficient documentation

## 2019-01-22 DIAGNOSIS — R509 Fever, unspecified: Secondary | ICD-10-CM | POA: Insufficient documentation

## 2019-01-22 LAB — SARS CORONAVIRUS 2 (TAT 6-24 HRS): SARS Coronavirus 2: POSITIVE — AB

## 2019-01-22 IMAGING — DX DG CHEST 1V PORT
1 series · 1 of 1 positions shown · non-contrast
Comparison: None.

CLINICAL DATA: Body aches, chills and loss of the sense is of taste
and smell for 3 days.

EXAM:
PORTABLE CHEST 1 VIEW

[chest ap]
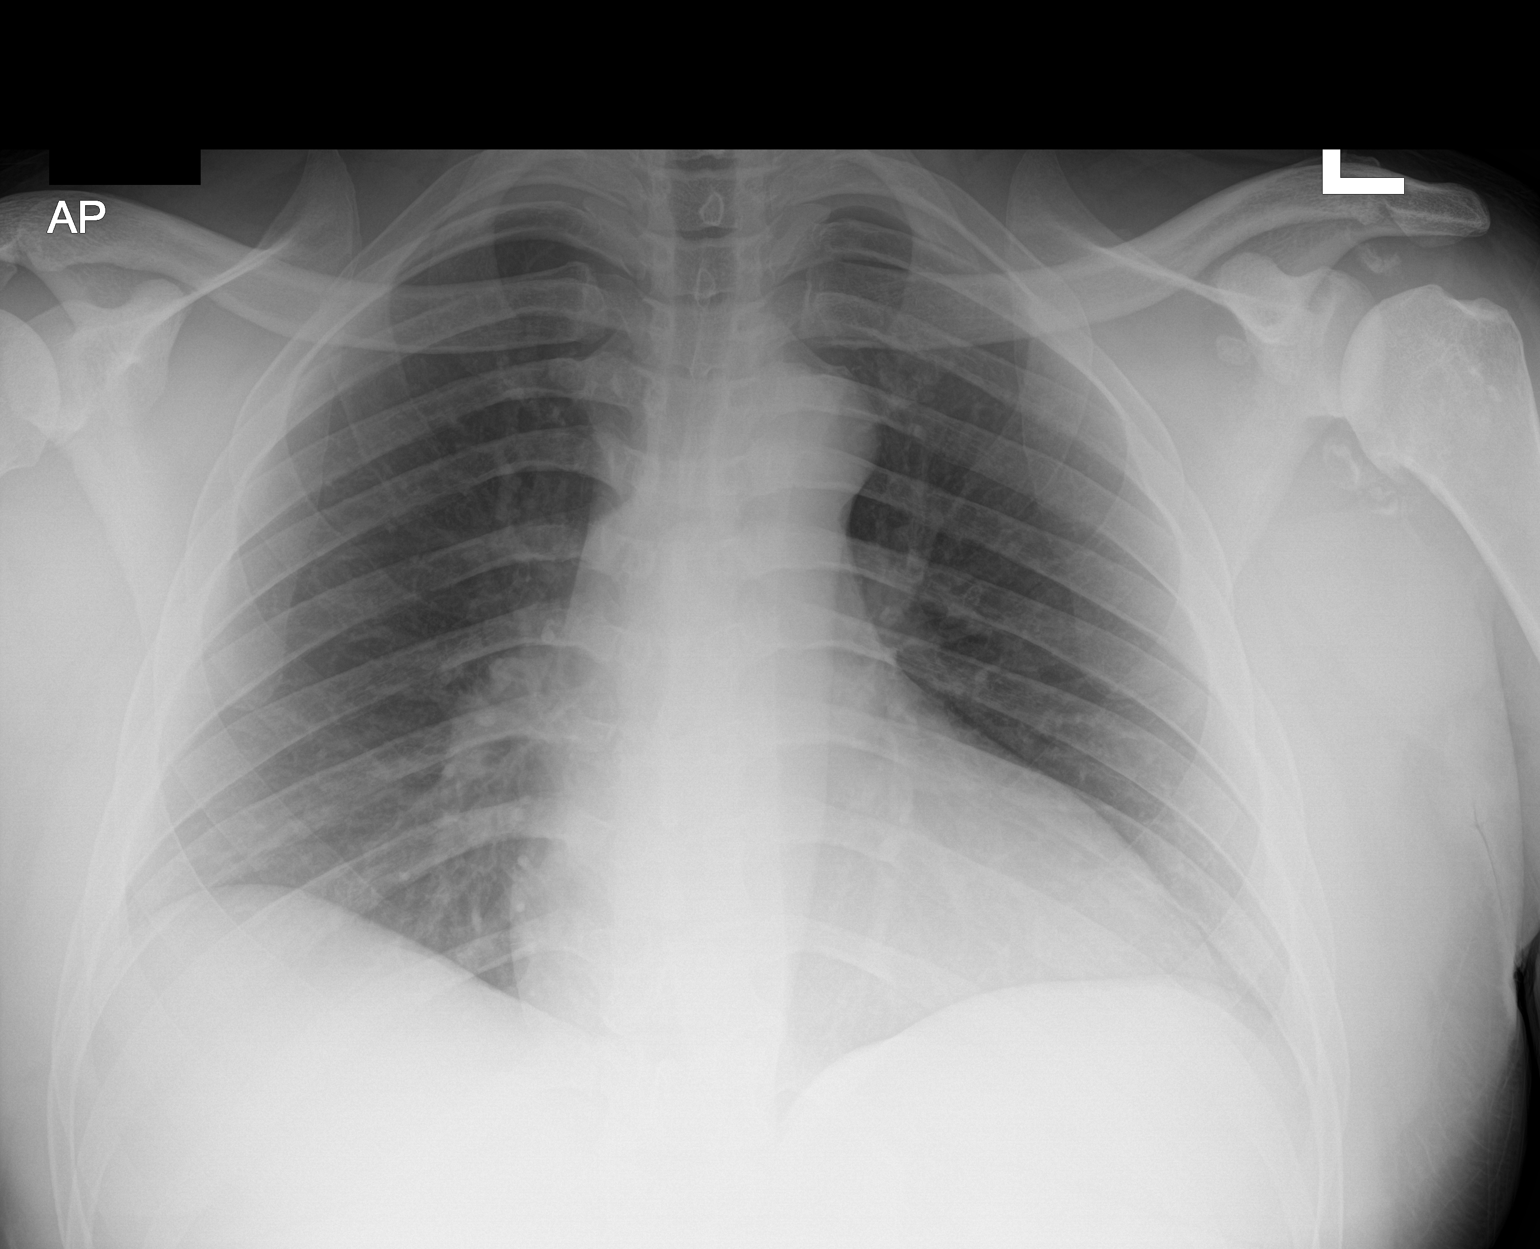

[1 of 1 positions shown; findings below may reference images not displayed]

FINDINGS: Lungs are clear. Heart size is normal. No pneumothorax or pleural
fluid. No acute or focal bony abnormality.
IMPRESSION: Normal chest.

## 2019-01-22 MED ORDER — IBUPROFEN 800 MG PO TABS: 800.0000 mg | ORAL_TABLET | Freq: Three times a day (TID) | ORAL | 0 refills | Status: DC

## 2019-01-22 MED ORDER — IBUPROFEN 800 MG PO TABS: 800.0000 mg | ORAL_TABLET | Freq: Three times a day (TID) | ORAL | 0 refills | Status: AC | PRN

## 2019-01-22 MED FILL — IBUPROFEN 800 MG TAB: 800 | 7 days supply | Qty: 21 | Fill #0

## 2019-01-22 NOTE — ED Triage Notes (Signed)
Pt c/o body aches, chills, loss of taste and smell x 3d

## 2019-01-22 NOTE — ED Provider Notes (Signed)
[facility] Provider Note   CSN: [id number] Arrival date & time: 01/22/19  [id number]     History   Chief Complaint Chief Complaint  Patient presents with  . Generalized Body Aches    HPI Walter Richardson is a 38 y.o. male with no significant PMH who presents to the ED with 3-day history of fevers, chills, runny nose, body aches, mild cough, headache, loose nonbloody stools, and loss of smell.  Patient does not get flu vaccines.  He denies any obvious sick contacts but works in [employer].  He reports that yesterday his fever was as high as 102 F.  He has been taking Tylenol and over-the-counter cold and flu medications, with good relief.  Patient also states that he has been hydrating well.  He took Tylenol this morning which is why he believes his fever is down here in the ED.  He denies any abdominal pain, nausea or vomiting, sore throat, chest pain or shortness of breath, dizziness, or urinary symptoms.  Patient lives with his son, but reports that his son is staying with his grandparents until his symptoms improve.     HPI  History reviewed. No pertinent past medical history.  There are no active problems to display for this patient.   Past Surgical History:  Procedure Laterality Date  . FRACTURE SURGERY          Home Medications    Prior to Admission medications   Medication Sig Start Date End Date Taking? Authorizing Provider  hydrocortisone-pramoxine (ANALPRAM HC) 2.5-1 % rectal cream Place 1 application rectally 3 (three) times daily. 07/26/16   Janne Napoleon, NP  ibuprofen (ADVIL) 800 MG tablet Take 1 tablet (800 mg total) by mouth every 8 (eight) hours as needed (fever and/or body aches). 01/22/19   Corena Herter, PA-C  naproxen (NAPROSYN) 375 MG tablet Take 1 tablet (375 mg total) by mouth 2 (two) times daily. 07/26/16   Janne Napoleon, NP  Omega-3 Fatty Acids (FISH OIL) 1000 MG CAPS Take by mouth.    [provider]   OVER THE COUNTER MEDICATION Take 1 tablet by mouth daily. OTC Weight loss pill    [provider]  promethazine (PHENERGAN) 25 MG tablet Take 1 tablet (25 mg total) by mouth every 6 (six) hours as needed for nausea. 04/18/12   Leonard Schwartz, MD  traMADol (ULTRAM) 50 MG tablet Take 1 tablet (50 mg total) by mouth every 6 (six) hours as needed. 07/26/16   Janne Napoleon, NP    Family History No family history on file.  Social History Social History   Tobacco Use  . Smoking status: Current Every Day Smoker    Types: Cigarettes  . Smokeless tobacco: Never Used  Substance Use Topics  . Alcohol use: Yes    Comment: occasional  . Drug use: No     Allergies   Patient has no known allergies.   Review of Systems Review of Systems  All other systems reviewed and are negative.    Physical Exam Updated Vital Signs BP 135/87 (BP Location: Right Arm)   Pulse 88   Temp 98.2 F (36.8 C) (Oral)   Resp 16   Ht 5\' 10"  (1.778 m)   Wt 103.4 kg   SpO2 97%   BMI 32.71 kg/m   Physical Exam Vitals signs and nursing note reviewed. Exam conducted with a chaperone present.  Constitutional:      Appearance: Normal appearance.  HENT:  Head: Normocephalic and atraumatic.     Nose: Rhinorrhea present.  Eyes:     General: No scleral icterus.    Conjunctiva/sclera: Conjunctivae normal.  Neck:     Musculoskeletal: Normal range of motion.  Cardiovascular:     Rate and Rhythm: Normal rate and regular rhythm.     Pulses: Normal pulses.     Heart sounds: Normal heart sounds.  Pulmonary:     Effort: Pulmonary effort is normal. No respiratory distress.     Breath sounds: Normal breath sounds. No wheezing or rales.  Abdominal:     General: Abdomen is flat. There is no distension.     Palpations: Abdomen is soft.     Tenderness: There is no abdominal tenderness. There is no guarding.  Musculoskeletal:     Right lower leg: No edema.     Left lower leg: No edema.  Skin:    General:  Skin is dry.  Neurological:     Mental Status: He is alert.     GCS: GCS eye subscore is 4. GCS verbal subscore is 5. GCS motor subscore is 6.  Psychiatric:        Mood and Affect: Mood normal.        Behavior: Behavior normal.        Thought Content: Thought content normal.      ED Treatments / Results  Labs (all labs ordered are listed, but only abnormal results are displayed) Labs Reviewed  SARS CORONAVIRUS 2 (TAT 6-24 HRS)    EKG None  Radiology Dg Chest Port 1 View  Result Date: 01/22/2019 CLINICAL DATA:  Body aches, chills and loss of the sense is of taste and smell for 3 days. EXAM: PORTABLE CHEST 1 VIEW COMPARISON:  None. FINDINGS: Lungs are clear. Heart size is normal. No pneumothorax or pleural fluid. No acute or focal bony abnormality. IMPRESSION: Normal chest. Electronically Signed   By: Inge Rise M.D.   On: 01/22/2019 11:04    Procedures Procedures (including critical care time)  Medications Ordered in ED Medications - No data to display   Initial Impression / Assessment and Plan / ED Course  I have reviewed the triage vital signs and the nursing notes.  Pertinent labs & imaging results that were available during my care of the patient were reviewed by me and considered in my medical decision making (see chart for details).       Patient's history and physical exam is consistent with a viral upper respiratory infection, possibly COVID-19.  Will obtain COVID-19 testing with isolation precautions pending results.  Offered influenza testing as well, but patient declined.  Discussed why it would likely not change management anyway.  Patient denies any significant shortness of breath or chest pain.  He has no history of clots or coagulopathy and I have low suspicion for PE or ACS.  DG chest was obtained and demonstrates no evidence of infiltrates concerning for pneumonia.  No other obvious acute cardiopulmonary disease.  Instruct patient to continue taking  his Tylenol and over-the-counter cold and flu medications, as directed.  I will also prescribe him ibuprofen to take for his body aches and fever in the event Tylenol is inadequate.  Patient voiced understanding and is agreeable to plan.  Walter Richardson was evaluated in Emergency Department on 01/22/2019 for the symptoms described in the history of present illness. He was evaluated in the context of the global COVID-19 pandemic, which necessitated consideration that the patient might be at  risk for infection with the SARS-CoV-2 virus that causes COVID-19. Institutional protocols and algorithms that pertain to the evaluation of patients at risk for COVID-19 are in a state of rapid change based on information released by regulatory bodies including the CDC and federal and state organizations. These policies and algorithms were followed during the patient's care in the ED.   Final Clinical Impressions(s) / ED Diagnoses   Final diagnoses:  Flu-like symptoms    ED Discharge Orders         Ordered    ibuprofen (ADVIL) 800 MG tablet  3 times daily,   Status:  Discontinued     01/22/19 1125    ibuprofen (ADVIL) 800 MG tablet  Every 8 hours PRN     01/22/19 1126           Reita Chard 01/22/19 1127    Hayden Rasmussen, MD 01/22/19 1745

## 2019-01-22 NOTE — Discharge Instructions (Signed)
Return to the ED or seek medical attention for any new or worsening symptoms.  Continue take your Tylenol and over-the-counter flu medications for symptomatic relief.  I prescribed ibuprofen which will help with your fever control and body aches in addition to the Tylenol.   If you live with, or provide care at home for, a person confirmed to have, or being evaluated for, COVID-19 infection please follow these guidelines to prevent infection:  Follow healthcare providers instructions Make sure that you understand and can help the patient follow any healthcare provider instructions for all care.  Provide for the patients basic needs You should help the patient with basic needs in the home and provide support for getting groceries, prescriptions, and other personal needs.  Monitor the patients symptoms If they are getting sicker, call his or her medical provider a  This will help the healthcare providers office take steps to keep other people from getting infected. Ask the healthcare provider to call the local or state health department.  Limit the number of people who have contact with the patient If possible, have only one caregiver for the patient. Other household members should stay in another home or place of residence. If this is not possible, they should stay in another room, or be separated from the patient as much as possible. Use a separate bathroom, if available. Restrict visitors who do not have an essential need to be in the home.  Keep older adults, very young children, and other sick people away from the patient Keep older adults, very young children, and those who have compromised immune systems or chronic health conditions away from the patient. This includes people with chronic heart, lung, or kidney conditions, diabetes, and cancer.  Ensure good ventilation Make sure that shared spaces in the home have good air flow, such as from an air conditioner or an opened  window, weather permitting.  Wash your hands often Wash your hands often and thoroughly with soap and water for at least 20 seconds. You can use an alcohol based hand sanitizer if soap and water are not available and if your hands are not visibly dirty. Avoid touching your eyes, nose, and mouth with unwashed hands. Use disposable paper towels to dry your hands. If not available, use dedicated cloth towels and replace them when they become wet.  Wear a facemask and gloves Wear a disposable facemask at all times in the room and gloves when you touch or have contact with the patients blood, body fluids, and/or secretions or excretions, such as sweat, saliva, sputum, nasal mucus, vomit, urine, or feces.  Ensure the mask fits over your nose and mouth tightly, and do not touch it during use. Throw out disposable facemasks and gloves after using them. Do not reuse. Wash your hands immediately after removing your facemask and gloves. If your personal clothing becomes contaminated, carefully remove clothing and launder. Wash your hands after handling contaminated clothing. Place all used disposable facemasks, gloves, and other waste in a lined container before disposing them with other household waste. Remove gloves and wash your hands immediately after handling these items.  Do not share dishes, glasses, or other household items with the patient Avoid sharing household items. You should not share dishes, drinking glasses, cups, eating utensils, towels, bedding, or other items After the person uses these items, you should wash them thoroughly with soap and water.  Wash laundry thoroughly Immediately remove and wash clothes or bedding that have blood, body fluids, and/or secretions or  excretions, such as sweat, saliva, sputum, nasal mucus, vomit, urine, or feces, on them. Wear gloves when handling laundry from the patient. Read and follow directions on labels of laundry or clothing items and detergent.  In general, wash and dry with the warmest temperatures recommended on the label.  Clean all areas the individual has used often Clean all touchable surfaces, such as counters, tabletops, doorknobs, bathroom fixtures, toilets, phones, keyboards, tablets, and bedside tables, every day. Also, clean any surfaces that may have blood, body fluids, and/or secretions or excretions on them. Wear gloves when cleaning surfaces the patient has come in contact with. Use a diluted bleach solution (e.g., dilute bleach with 1 part bleach and 10 parts water) or a household disinfectant with a label that says EPA-registered for coronaviruses. To make a bleach solution at home, add 1 tablespoon of bleach to 1 quart (4 cups) of water. For a larger supply, add  cup of bleach to 1 gallon (16 cups) of water. Read labels of cleaning products and follow recommendations provided on product labels. Labels contain instructions for safe and effective use of the cleaning product including precautions you should take when applying the product, such as wearing gloves or eye protection and making sure you have good ventilation during use of the product. Remove gloves and wash hands immediately after cleaning.  Monitor yourself for signs and symptoms of illness Caregivers and household members are considered close contacts, should monitor their health, and will be asked to limit movement outside of the home to the extent possible. Follow the monitoring steps for close contacts listed on the symptom monitoring form.   ? If you have additional questions, contact your local health department or call the epidemiologist on call at [phone] (available 24/7). ? This guidance is subject to change. For the most up-to-date guidance from [facility], please refer to their website: [email or web address]

## 2019-07-19 DIAGNOSIS — I10 Essential (primary) hypertension: Secondary | ICD-10-CM | POA: Insufficient documentation

## 2019-09-20 DIAGNOSIS — D473 Essential (hemorrhagic) thrombocythemia: Secondary | ICD-10-CM | POA: Insufficient documentation

## 2019-11-01 DIAGNOSIS — Z09 Encounter for follow-up examination after completed treatment for conditions other than malignant neoplasm: Secondary | ICD-10-CM | POA: Insufficient documentation

## 2020-09-26 ENCOUNTER — Ambulatory Visit (INDEPENDENT_AMBULATORY_CARE_PROVIDER_SITE_OTHER): Payer: Self-pay

## 2020-09-26 ENCOUNTER — Ambulatory Visit (HOSPITAL_COMMUNITY)
Admission: EM | Admit: 2020-09-26 | Discharge: 2020-09-26 | Disposition: A | Discharge status: Home / Self Care | Payer: Self-pay

## 2020-09-26 ENCOUNTER — Encounter (HOSPITAL_COMMUNITY): Payer: Self-pay

## 2020-09-26 ENCOUNTER — Other Ambulatory Visit: Payer: Self-pay

## 2020-09-26 DIAGNOSIS — M79675 Pain in left toe(s): Secondary | ICD-10-CM

## 2020-09-26 DIAGNOSIS — S99922A Unspecified injury of left foot, initial encounter: Secondary | ICD-10-CM

## 2020-09-26 DIAGNOSIS — Y9367 Activity, basketball: Secondary | ICD-10-CM

## 2020-09-26 IMAGING — DX DG TOE GREAT 2+V*L*
3 series · 3 of 3 positions shown · non-contrast
Comparison: None.

CLINICAL DATA: Pain and swelling.  Injury after playing basketball.

EXAM:
LEFT GREAT TOE

[toe ap]
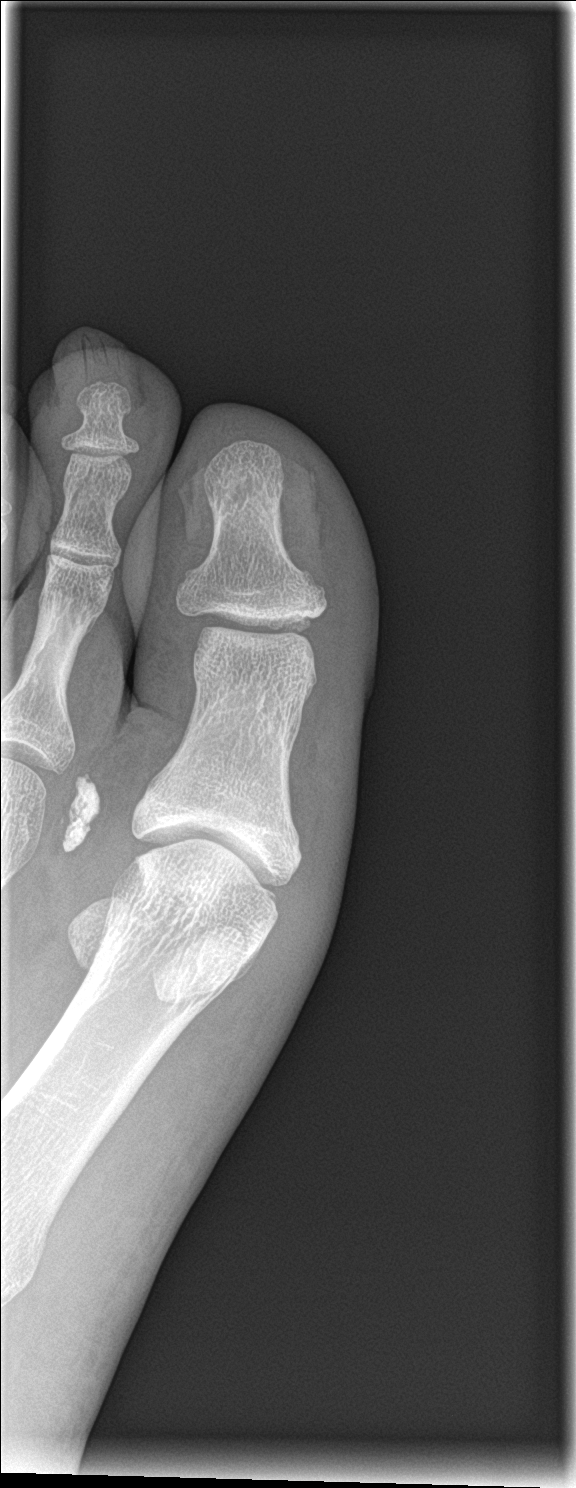

[toe obl]
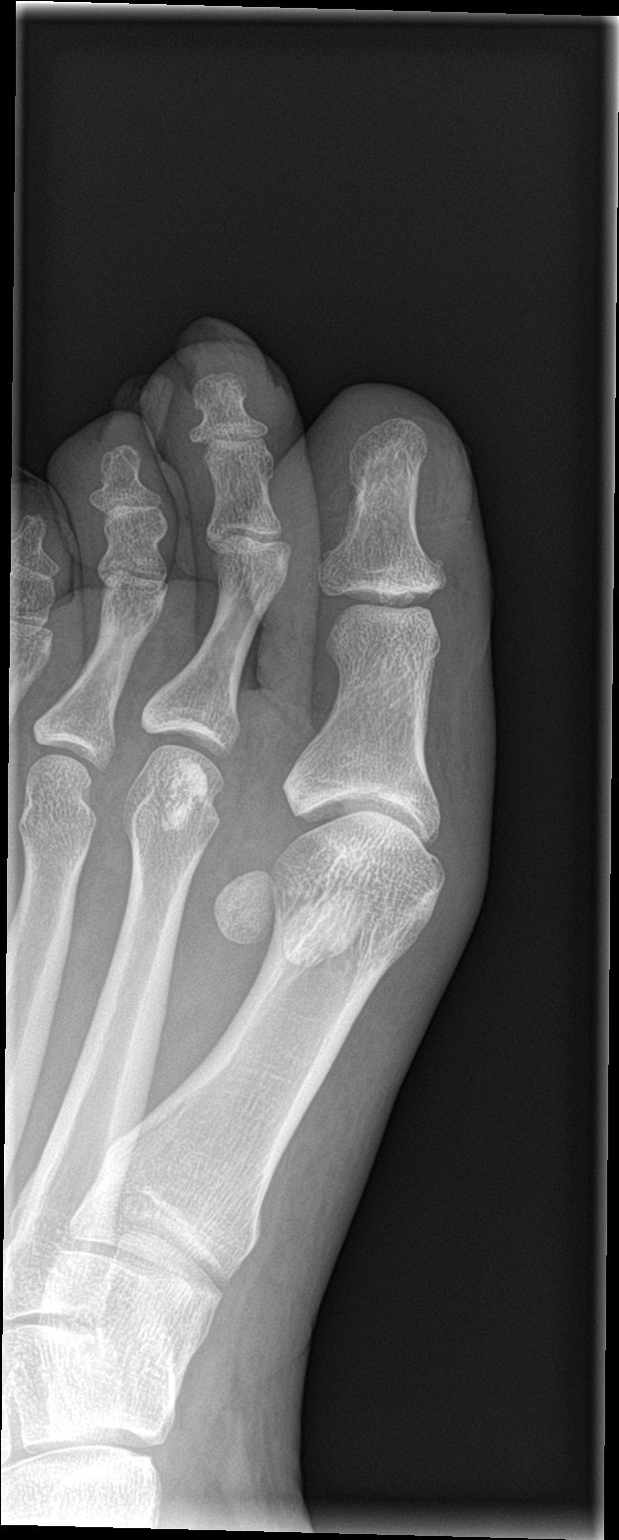

[toe lat]
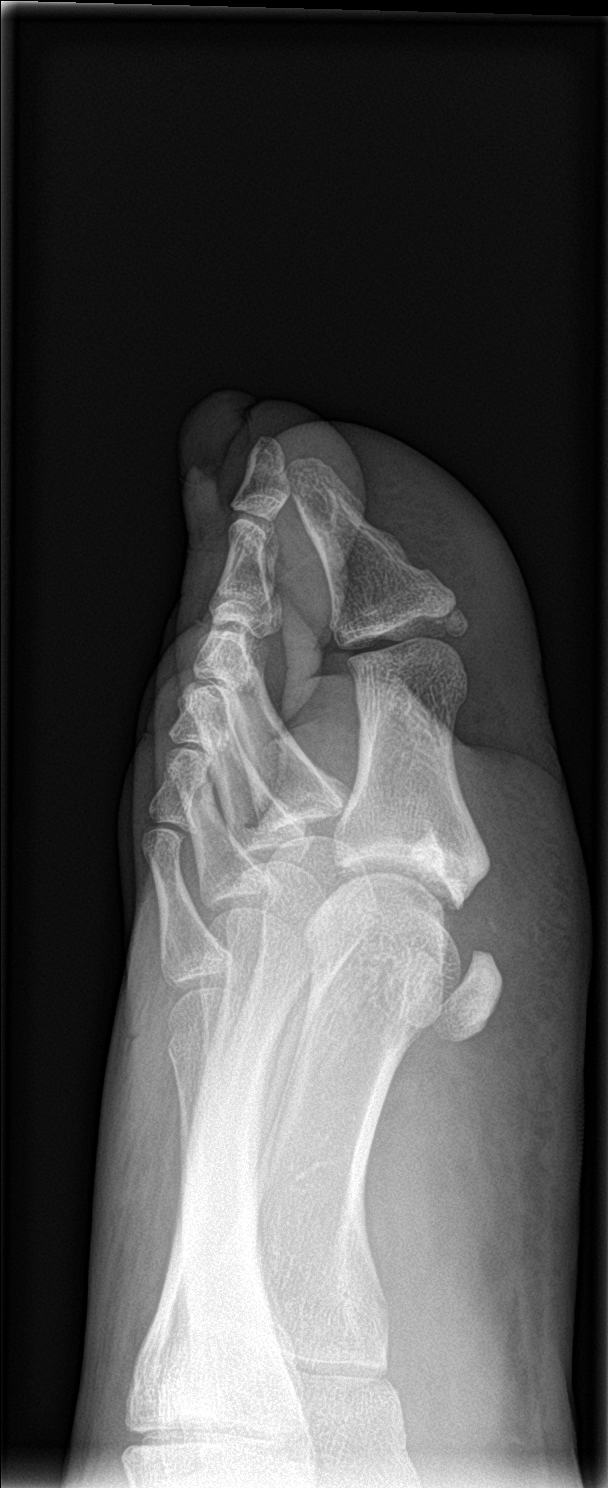

[3 of 3 positions shown; findings below may reference images not displayed]

FINDINGS: There is no evidence of fracture or dislocation. There is no
evidence of arthropathy or other focal bone abnormality. Mild
diffuse soft tissue swelling. 1 by 0.3 cm calcific density noted
within the soft tissues between the first and second MTP joints
noted, which is of uncertain clinical significance. This may be
related to remote trauma.
IMPRESSION: 1. No acute bone abnormality.
2. Soft tissue swelling.

## 2020-09-26 MED ORDER — NAPROXEN 375 MG PO TABS: 375.0000 mg | ORAL_TABLET | Freq: Two times a day (BID) | ORAL | 0 refills | Status: AC

## 2020-09-26 NOTE — Discharge Instructions (Addendum)
Take Naprosyn twice daily for up to 10 days.  Do not take additional NSAIDs including aspirin, ibuprofen/Advil, naproxen/Aleve with this medication due to risk of stomach bleeding.  You can use Tylenol for breakthrough pain.  Keep foot elevated and use ice.  If you have any worsening symptoms including increased pain, numbness, tingling you need to be reevaluated.  If symptoms do not improve within a few days please follow-up with podiatry as we discussed.

## 2020-09-26 NOTE — ED Triage Notes (Signed)
Patient presents to Urgent Care with complaints of left big toe injury from playing basketball x 2 days ago. Pt states increased swelling and pain with ambulation.

## 2020-09-26 NOTE — ED Provider Notes (Signed)
[place]    CSN: [id number] Arrival date & time: 09/26/20  1055      History   Chief Complaint Chief Complaint  Patient presents with  . Toe Injury    HPI Walter Richardson is a 40 y.o. male.   Patient presents today with a 2-day history of left great toe pain.  Reports that he was playing basketball when he took a misstep stubbed his toe and fell to the ground.  He was able to get up and finish the game but has had worsening pain since that time.  Pain is rated 9 on a 0-10 pain scale, localized to left great MTP, described as throbbing/aching, worse with standing or attempted ambulation, no alleviating factors identified.  He has tried topical icy hot without improvement of symptoms.  He denies any weakness, numbness, paresthesias.  He denies previous injury or surgery to this area.  Denies history of gout or diabetes.  He is unable to perform daily activities as result of symptoms.   History reviewed. No pertinent past medical history.  There are no problems to display for this patient.   Past Surgical History:  Procedure Laterality Date  . FRACTURE SURGERY         Home Medications    Prior to Admission medications   Medication Sig Start Date End Date Taking? Authorizing Provider  losartan-hydrochlorothiazide (HYZAAR) 50-12.5 MG tablet Take 1 tablet by mouth daily. 09/20/19  Yes [provider]  aspirin 325 MG tablet Take by mouth.    [provider]  hydrocortisone-pramoxine (ANALPRAM HC) 2.5-1 % rectal cream Place 1 application rectally 3 (three) times daily. 07/26/16   Janne Napoleon, NP  ibuprofen (ADVIL) 800 MG tablet Take 1 tablet (800 mg total) by mouth every 8 (eight) hours as needed (fever and/or body aches). 01/22/19   Corena Herter, PA-C  naproxen (NAPROSYN) 375 MG tablet Take 1 tablet (375 mg total) by mouth 2 (two) times daily. 09/26/20   Nolyn Swab, Derry Skill, PA-C  Omega-3 Fatty Acids (FISH OIL) 1000 MG CAPS Take by mouth.    [provider]  OVER THE COUNTER MEDICATION Take 1 tablet by mouth daily. OTC Weight loss pill    [provider]  promethazine (PHENERGAN) 25 MG tablet Take 1 tablet (25 mg total) by mouth every 6 (six) hours as needed for nausea. 04/18/12   Leonard Schwartz, MD  traMADol (ULTRAM) 50 MG tablet Take 1 tablet (50 mg total) by mouth every 6 (six) hours as needed. 07/26/16   Janne Napoleon, NP    Family History History reviewed. No pertinent family history.  Social History Social History   Tobacco Use  . Smoking status: Every Day    Types: Cigarettes  . Smokeless tobacco: Never  Vaping Use  . Vaping Use: Never used  Substance Use Topics  . Alcohol use: Yes    Comment: occasional  . Drug use: No     Allergies   Patient has no known allergies.   Review of Systems Review of Systems  Constitutional:  Positive for activity change. Negative for appetite change, fatigue and fever.  Respiratory:  Negative for cough and shortness of breath.   Cardiovascular:  Negative for chest pain.  Gastrointestinal:  Negative for abdominal pain, diarrhea, nausea and vomiting.  Musculoskeletal:  Positive for arthralgias, gait problem and joint swelling. Negative for myalgias.  Skin:  Positive for color change.  Neurological:  Negative for dizziness, weakness, light-headedness, numbness and headaches.  Physical Exam Triage Vital Signs ED Triage Vitals  Enc Vitals Group     BP 09/26/20 1145 114/75     Pulse Rate 09/26/20 1145 79     Resp 09/26/20 1145 16     Temp 09/26/20 1145 (!) 97.5 F (36.4 C)     Temp Source 09/26/20 1145 Temporal     SpO2 09/26/20 1145 100 %     Weight --      Height --      Head Circumference --      Peak Flow --      Pain Score 09/26/20 1142 9     Pain Loc --      Pain Edu? --      Excl. in [place]? --    No data found.  Updated Vital Signs BP 114/75 (BP Location: Right Arm)   Pulse 79   Temp (!) 97.5 F (36.4 C) (Temporal)   Resp 16   SpO2 100%    Visual Acuity Right Eye Distance:   Left Eye Distance:   Bilateral Distance:    Right Eye Near:   Left Eye Near:    Bilateral Near:     Physical Exam Vitals reviewed.  Constitutional:      General: He is awake.     Appearance: Normal appearance. He is normal weight. He is not ill-appearing.     Comments: Very pleasant male appears stated age in no acute distress  HENT:     Head: Normocephalic and atraumatic.  Cardiovascular:     Rate and Rhythm: Normal rate and regular rhythm.     Pulses:          Posterior tibial pulses are 2+ on the right side and 2+ on the left side.     Heart sounds: Normal heart sounds, S1 normal and S2 normal. No murmur heard. Pulmonary:     Effort: Pulmonary effort is normal.     Breath sounds: Normal breath sounds. No stridor. No wheezing, rhonchi or rales.     Comments: Clear to auscultation bilaterally Musculoskeletal:     Left foot: Decreased range of motion. Normal capillary refill. Swelling, tenderness and bony tenderness present. No deformity.     Comments: Left foot: Decreased range of motion of left great toe secondary to swelling and pain.  Bruising and swelling noted over left great MTP joint.  Significant tenderness palpation at the left great MTP joint.  Foot neurovascularly intact.  Neurological:     Mental Status: He is alert.  Psychiatric:        Behavior: Behavior is cooperative.     UC Treatments / Results  Labs (all labs ordered are listed, but only abnormal results are displayed) Labs Reviewed - No data to display  EKG   Radiology DG Toe Great Left  Result Date: 09/26/2020 CLINICAL DATA:  Pain and swelling.  Injury after playing basketball. EXAM: LEFT GREAT TOE COMPARISON:  None. FINDINGS: There is no evidence of fracture or dislocation. There is no evidence of arthropathy or other focal bone abnormality. Mild diffuse soft tissue swelling. 1 by 0.3 cm calcific density noted within the soft tissues between the first and  second MTP joints noted, which is of uncertain clinical significance. This may be related to remote trauma. IMPRESSION: 1. No acute bone abnormality. 2. Soft tissue swelling. Electronically Signed   By: Kerby Moors M.D.   On: 09/26/2020 12:48    Procedures Procedures (including critical care time)  Medications Ordered in UC Medications -  No data to display  Initial Impression / Assessment and Plan / UC Course  I have reviewed the triage vital signs and the nursing notes.  Pertinent labs & imaging results that were available during my care of the patient were reviewed by me and considered in my medical decision making (see chart for details).     X-ray obtained given traumatic injury and bony tenderness showed no acute abnormalities.  Patient was placed in postop shoe for comfort.  He was prescribed Naprosyn to be taken up to twice a day as needed for pain and inflammation with instruction not to take additional NSAIDs due to risk of GI bleeding.  He can use Tylenol for breakthrough pain.  Discussed alarm symptoms that warrant emergent evaluation.  Discussed that if symptoms do not improve within a few days he should follow-up with podiatry and was given contact information for local group.  Strict return precautions given to which patient expressed understanding.  Final Clinical Impressions(s) / UC Diagnoses   Final diagnoses:  Great toe pain, left  Injury of toe on left foot, initial encounter     Discharge Instructions      Take Naprosyn twice daily for up to 10 days.  Do not take additional NSAIDs including aspirin, ibuprofen/Advil, naproxen/Aleve with this medication due to risk of stomach bleeding.  You can use Tylenol for breakthrough pain.  Keep foot elevated and use ice.  If you have any worsening symptoms including increased pain, numbness, tingling you need to be reevaluated.  If symptoms do not improve within a few days please follow-up with podiatry as we  discussed.     ED Prescriptions     Medication Sig Dispense Auth. Provider   naproxen (NAPROSYN) 375 MG tablet Take 1 tablet (375 mg total) by mouth 2 (two) times daily. 20 tablet Michalina Calbert, Derry Skill, PA-C      PDMP not reviewed this encounter.   Terrilee Croak, PA-C 09/26/20 1312

## 2021-01-12 ENCOUNTER — Other Ambulatory Visit: Payer: Self-pay

## 2021-01-12 ENCOUNTER — Emergency Department (HOSPITAL_COMMUNITY): Payer: Self-pay

## 2021-01-12 ENCOUNTER — Encounter (HOSPITAL_COMMUNITY): Payer: Self-pay

## 2021-01-12 ENCOUNTER — Emergency Department (HOSPITAL_COMMUNITY)
Admission: EM | Admit: 2021-01-12 | Discharge: 2021-01-13 | Disposition: A | Discharge status: Home / Self Care | Payer: Self-pay | Attending: Emergency Medicine | Admitting: Emergency Medicine

## 2021-01-12 ENCOUNTER — Ambulatory Visit (HOSPITAL_COMMUNITY)
Admission: EM | Admit: 2021-01-12 | Discharge: 2021-01-12 | Disposition: A | Discharge status: Home / Self Care | Payer: Self-pay

## 2021-01-12 DIAGNOSIS — R059 Cough, unspecified: Secondary | ICD-10-CM | POA: Insufficient documentation

## 2021-01-12 DIAGNOSIS — I10 Essential (primary) hypertension: Secondary | ICD-10-CM

## 2021-01-12 DIAGNOSIS — H532 Diplopia: Secondary | ICD-10-CM | POA: Insufficient documentation

## 2021-01-12 DIAGNOSIS — J029 Acute pharyngitis, unspecified: Secondary | ICD-10-CM | POA: Insufficient documentation

## 2021-01-12 DIAGNOSIS — F17210 Nicotine dependence, cigarettes, uncomplicated: Secondary | ICD-10-CM | POA: Insufficient documentation

## 2021-01-12 DIAGNOSIS — R791 Abnormal coagulation profile: Secondary | ICD-10-CM | POA: Insufficient documentation

## 2021-01-12 DIAGNOSIS — Z79899 Other long term (current) drug therapy: Secondary | ICD-10-CM | POA: Insufficient documentation

## 2021-01-12 LAB — I-STAT CHEM 8, ED
BUN: 19 mg/dL (ref 6–20)
Calcium, Ion: 0.99 mmol/L — ABNORMAL LOW (ref 1.15–1.40)
Chloride: 105 mmol/L (ref 98–111)
Creatinine, Ser: 1 mg/dL (ref 0.61–1.24)
Glucose, Bld: 70 mg/dL (ref 70–99)
HCT: 51 % (ref 39.0–52.0)
Hemoglobin: 17.3 g/dL — ABNORMAL HIGH (ref 13.0–17.0)
Potassium: 3.7 mmol/L (ref 3.5–5.1)
Sodium: 139 mmol/L (ref 135–145)
TCO2: 25 mmol/L (ref 22–32)

## 2021-01-12 LAB — COMPREHENSIVE METABOLIC PANEL
ALT: 62 U/L — ABNORMAL HIGH (ref 0–44)
AST: 33 U/L (ref 15–41)
Albumin: 4.4 g/dL (ref 3.5–5.0)
Alkaline Phosphatase: 50 U/L (ref 38–126)
Anion gap: 9 (ref 5–15)
BUN: 17 mg/dL (ref 6–20)
CO2: 27 mmol/L (ref 22–32)
Calcium: 9.1 mg/dL (ref 8.9–10.3)
Chloride: 101 mmol/L (ref 98–111)
Creatinine, Ser: 1.04 mg/dL (ref 0.61–1.24)
GFR, Estimated: >60 mL/min (ref >60)
Glucose, Bld: 68 mg/dL — ABNORMAL LOW (ref 70–99)
Potassium: 3.5 mmol/L (ref 3.5–5.1)
Sodium: 137 mmol/L (ref 135–145)
Total Bilirubin: 0.9 mg/dL (ref 0.3–1.2)
Total Protein: 7 g/dL (ref 6.5–8.1)

## 2021-01-12 LAB — CBC
HCT: 51.6 % (ref 39.0–52.0)
Hemoglobin: 16.9 g/dL (ref 13.0–17.0)
MCH: 29.6 pg (ref 26.0–34.0)
MCHC: 32.8 g/dL (ref 30.0–36.0)
MCV: 90.4 fL (ref 80.0–100.0)
Platelets: 893 10*3/uL — ABNORMAL HIGH (ref 150–400)
RBC: 5.71 MIL/uL (ref 4.22–5.81)
RDW: 14 % (ref 11.5–15.5)
WBC: 8.7 10*3/uL (ref 4.0–10.5)
nRBC: 0 % (ref 0.0–0.2)

## 2021-01-12 LAB — DIFFERENTIAL
Abs Immature Granulocytes: 0.02 10*3/uL (ref 0.00–0.07)
Basophils Absolute: 0.2 10*3/uL — ABNORMAL HIGH (ref 0.0–0.1)
Basophils Relative: 2 %
Eosinophils Absolute: 0.2 10*3/uL (ref 0.0–0.5)
Eosinophils Relative: 3 %
Immature Granulocytes: 0 %
Lymphocytes Relative: 26 %
Lymphs Abs: 2.3 10*3/uL (ref 0.7–4.0)
Monocytes Absolute: 0.9 10*3/uL (ref 0.1–1.0)
Monocytes Relative: 11 %
Neutro Abs: 5.1 10*3/uL (ref 1.7–7.7)
Neutrophils Relative %: 58 %

## 2021-01-12 LAB — APTT: aPTT: 32 seconds (ref 24–36)

## 2021-01-12 LAB — PROTIME-INR
INR: 1 (ref 0.8–1.2)
Prothrombin Time: 13.2 seconds (ref 11.4–15.2)

## 2021-01-12 IMAGING — CT CT HEAD W/O CM
4 series · 17 of 47 positions shown, 19 images · non-contrast
Comparison: None.

CLINICAL DATA: Neuro deficit, acute, stroke suspected

EXAM:
CT HEAD WITHOUT CONTRAST
TECHNIQUE: Contiguous axial images were obtained from the base of the skull
through the vertex without intravenous contrast.

[Series 3: head without · axial · non-contrast · 0.46mm/px · z∈[-90,+30]mm · 7 of 34 slices shown, 9 images]
[im 5/34  brain]
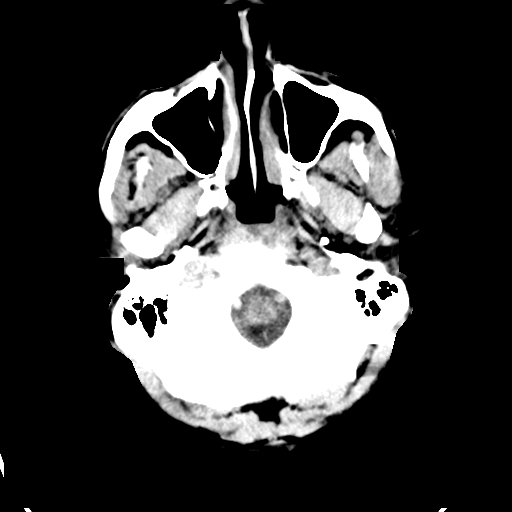
[im 5/34  bone]
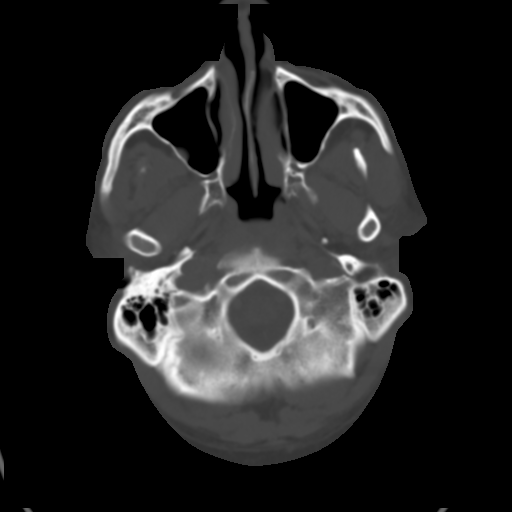
[im 9/34  brain]
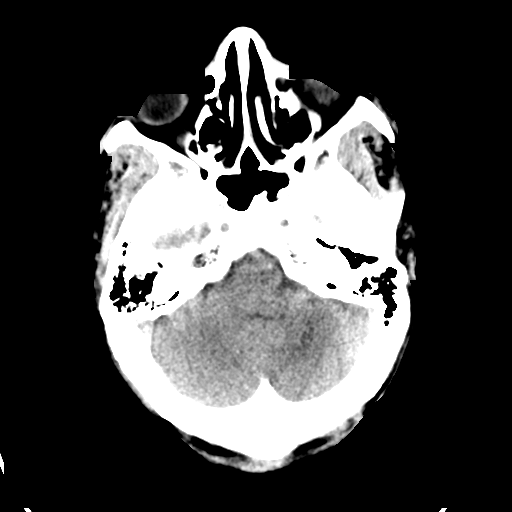
[im 13/34  brain]
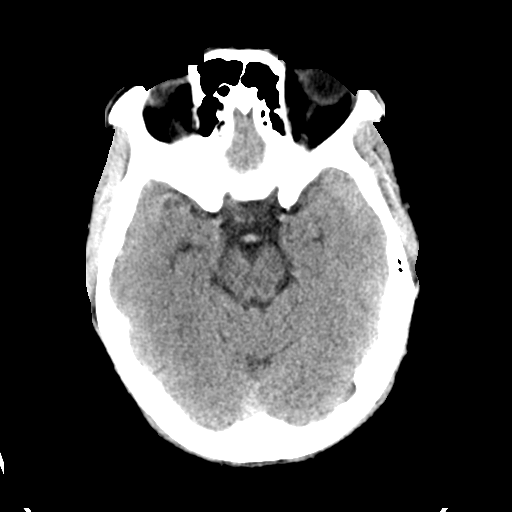
[im 17/34  brain]
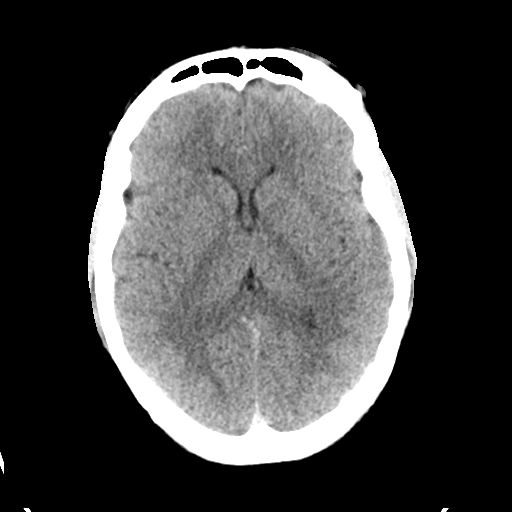
[im 21/34  brain]
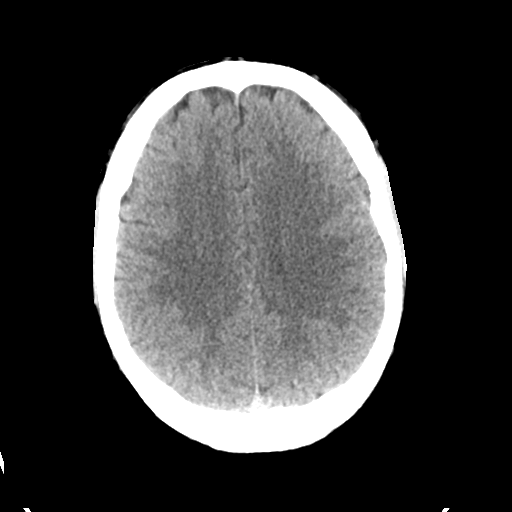
[im 21/34  bone]
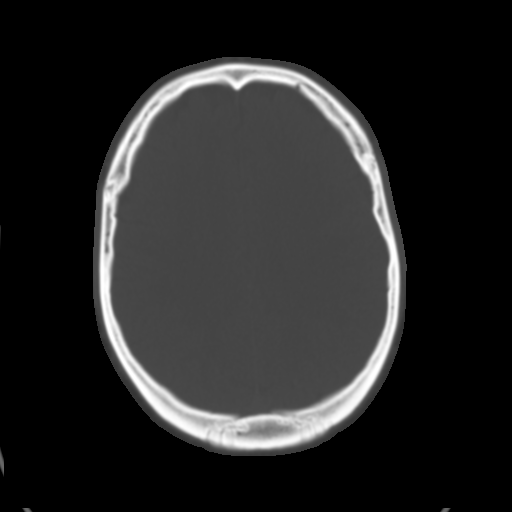
[im 25/34  brain]
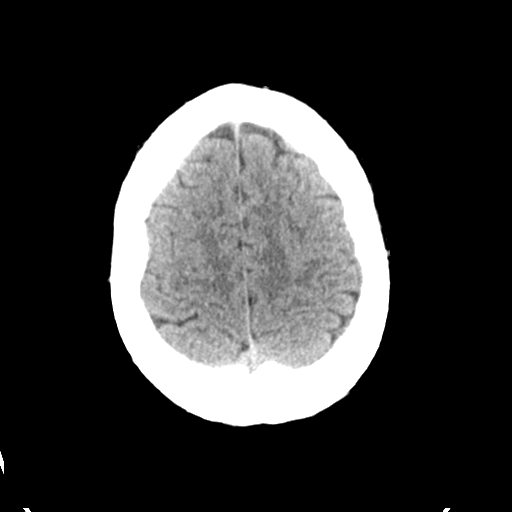
[im 29/34  brain]
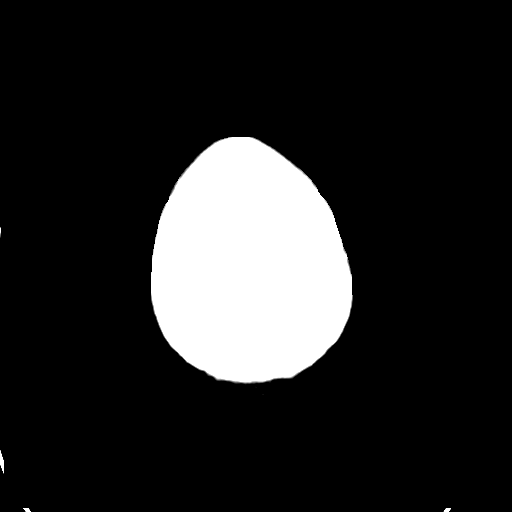

[Series 4: head bone · axial · 0.46mm/px · z∈[-94,-36]mm · 4 of 85 slices shown]
[im 9/85  bone]
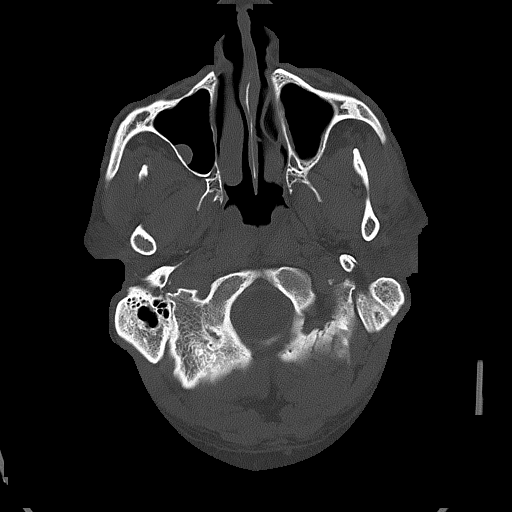
[im 17/85  bone]
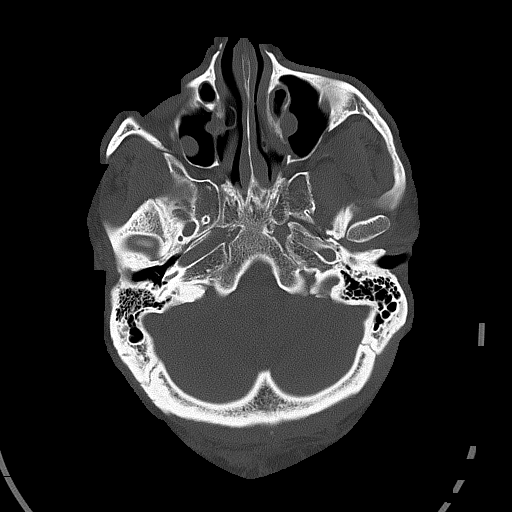
[im 26/85  bone]
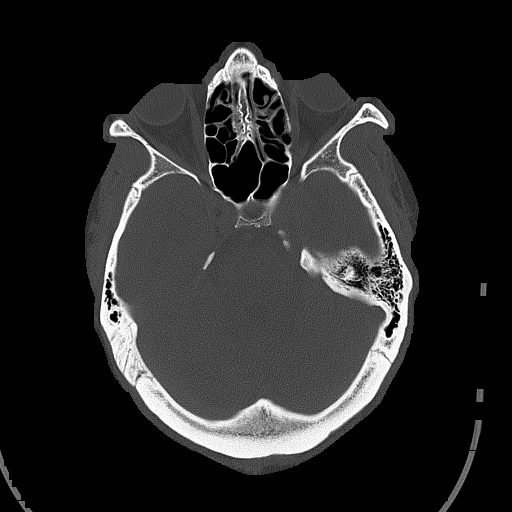
[im 38/85  bone]
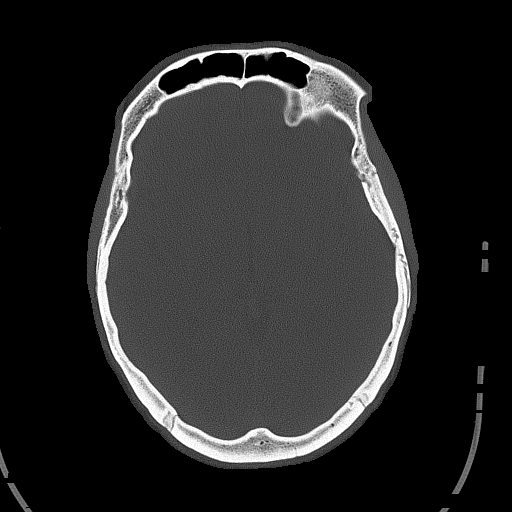

[Series 5: head without cor · coronal · non-contrast · 0.33mm/px · 3 of 73 slices shown]
[im 25/73  brain]
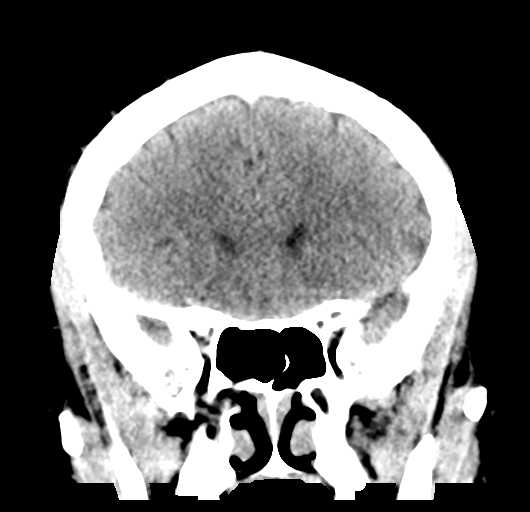
[im 33/73  brain]
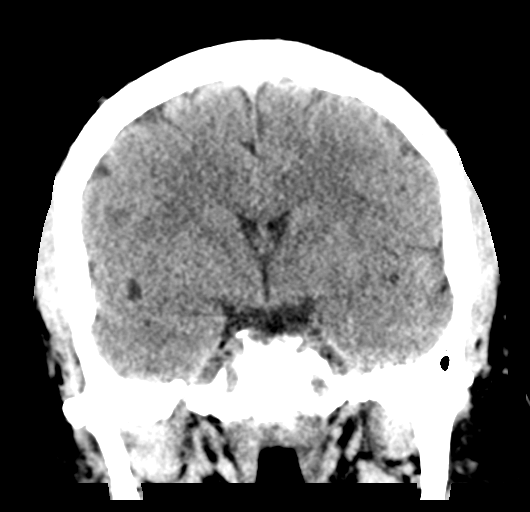
[im 41/73  brain]
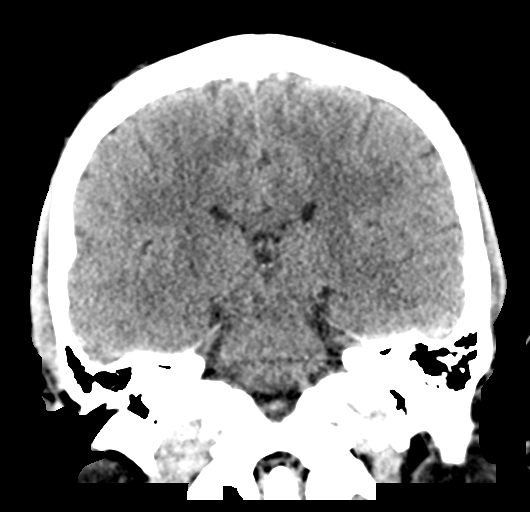

[Series 6: head without sag · sagittal · non-contrast · 0.33mm/px · 3 of 62 slices shown]
[im 21/62  brain]
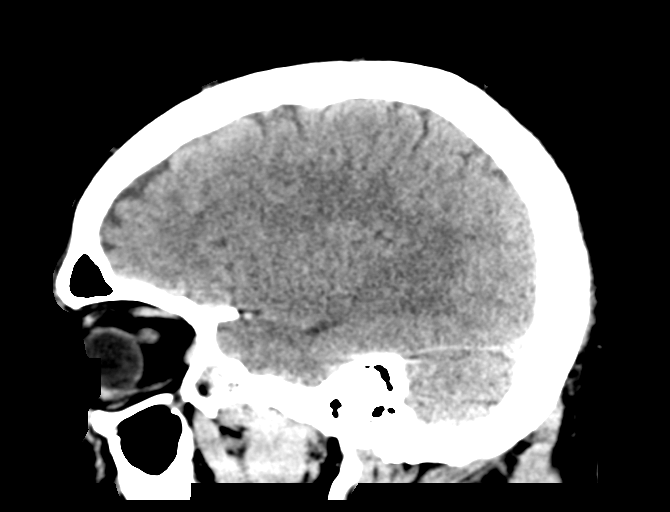
[im 31/62  brain]
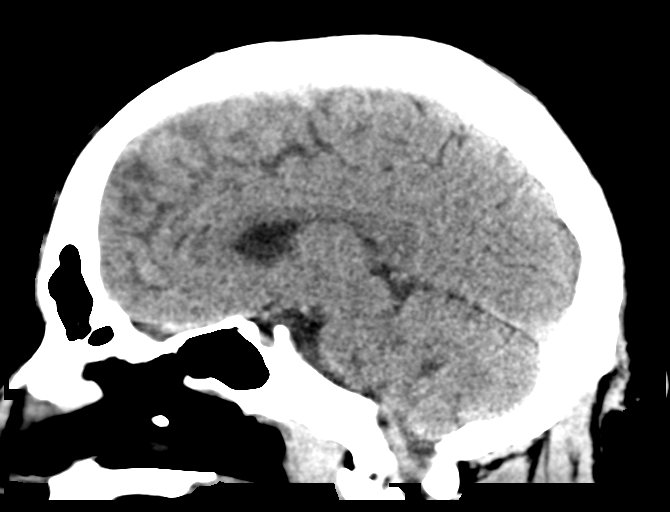
[im 41/62  brain]
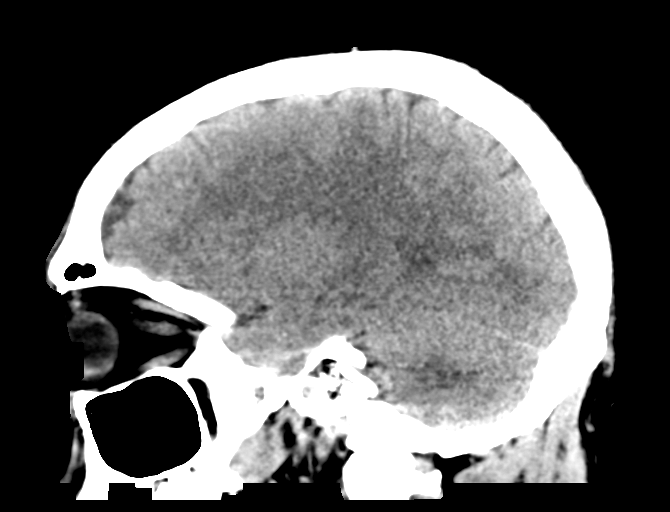

[17 of 47 positions shown; findings below may reference images not displayed]

FINDINGS: Brain:

No evidence of large-territorial acute infarction. No parenchymal
hemorrhage. No mass lesion. No extra-axial collection.

No mass effect or midline shift. No hydrocephalus. Basilar cisterns
are patent.

Vascular: No hyperdense vessel.

Skull: No acute fracture or focal lesion.

Sinuses/Orbits: Coastal thickening/polyps within the maxillary
sinuses. Paranasal sinuses and mastoid air cells are clear. The
orbits are unremarkable.

Other: None.
IMPRESSION: No acute intracranial abnormality.

## 2021-01-12 NOTE — ED Triage Notes (Signed)
Pt reports being unable to focus on anything visually x 1 week. Reports speech difficulty/expressive aphasia one week ago, which has resolved. Sent here for UC for stroke workup.

## 2021-01-12 NOTE — ED Triage Notes (Signed)
Pt has been having issues with having double vision and seeing things in his peripheal vision X 1 week.

## 2021-01-12 NOTE — Discharge Instructions (Signed)
I am very concerned that you have had a stroke. Please head straight to the ED.

## 2021-01-12 NOTE — ED Provider Notes (Signed)
[place]    CSN: [id number] Arrival date & time: 01/12/21  1237      History   Chief Complaint Chief Complaint  Patient presents with   Eye Problem    HPI Walter Richardson is a 40 y.o. male presenting with 1 week of right-sided diplopia following episode of slurred speech and difficulty forming words.  Medical history hypertension, taking his antihypertensives as directed.  States that he called his primary care to discuss his symptoms earlier today, they told him to go straight to the emergency department so he presented to this urgent care.  States that the slurred speech has resolved, but the visual deficits have persisted.  He has had episodes like this before when he got mad, but they always resolved on their own in the past.  Blood pressure is running 675F systolic at home.  Denies headaches, vision loss, weakness in arms or legs, syncope, chest pain, shortness of breath.  HPI  History reviewed. No pertinent past medical history.  There are no problems to display for this patient.   Past Surgical History:  Procedure Laterality Date   FRACTURE SURGERY         Home Medications    Prior to Admission medications   Medication Sig Start Date End Date Taking? Authorizing Provider  aspirin 325 MG tablet Take by mouth.    [provider]  hydrocortisone-pramoxine (ANALPRAM HC) 2.5-1 % rectal cream Place 1 application rectally 3 (three) times daily. 07/26/16   Janne Napoleon, NP  ibuprofen (ADVIL) 800 MG tablet Take 1 tablet (800 mg total) by mouth every 8 (eight) hours as needed (fever and/or body aches). 01/22/19   Corena Herter, PA-C  losartan-hydrochlorothiazide (HYZAAR) 50-12.5 MG tablet Take 1 tablet by mouth daily. 09/20/19   [provider]  naproxen (NAPROSYN) 375 MG tablet Take 1 tablet (375 mg total) by mouth 2 (two) times daily. 09/26/20   Raspet, Derry Skill, PA-C  Omega-3 Fatty Acids (FISH OIL) 1000 MG CAPS Take by mouth.    [provider]  OVER THE COUNTER MEDICATION Take 1 tablet by mouth daily. OTC Weight loss pill    [provider]  promethazine (PHENERGAN) 25 MG tablet Take 1 tablet (25 mg total) by mouth every 6 (six) hours as needed for nausea. 04/18/12   Leonard Schwartz, MD  traMADol (ULTRAM) 50 MG tablet Take 1 tablet (50 mg total) by mouth every 6 (six) hours as needed. 07/26/16   Janne Napoleon, NP    Family History Family History  Family history unknown: Yes    Social History Social History   Tobacco Use   Smoking status: Every Day    Types: Cigarettes   Smokeless tobacco: Never  Vaping Use   Vaping Use: Never used  Substance Use Topics   Alcohol use: Yes    Comment: occasional   Drug use: No     Allergies   Patient has no known allergies.   Review of Systems Review of Systems  Eyes:  Positive for visual disturbance.  All other systems reviewed and are negative.   Physical Exam Triage Vital Signs ED Triage Vitals  Enc Vitals Group     BP 01/12/21 1506 (!) 161/98     Pulse Rate 01/12/21 1506 98     Resp 01/12/21 1506 17     Temp 01/12/21 1506 98.8 F (37.1 C)     Temp Source 01/12/21 1506 Oral     SpO2 01/12/21 1506 98 %  Weight --      Height --      Head Circumference --      Peak Flow --      Pain Score 01/12/21 1508 0     Pain Loc --      Pain Edu? --      Excl. in [place]? --    No data found.  Updated Vital Signs BP (!) 161/98 (BP Location: Left Arm)   Pulse 98   Temp 98.8 F (37.1 C) (Oral)   Resp 17   SpO2 98%   Visual Acuity Right Eye Distance:   Left Eye Distance:   Bilateral Distance:    Right Eye Near:   Left Eye Near:    Bilateral Near:     Physical Exam Vitals reviewed.  Constitutional:      General: He is not in acute distress.    Appearance: Normal appearance. He is not ill-appearing.  HENT:     Head: Normocephalic and atraumatic.     Right Ear: Hearing, tympanic membrane, ear canal and external ear normal. No tenderness. No  middle ear effusion. There is no impacted cerumen. No mastoid tenderness. Tympanic membrane is not perforated, erythematous, retracted or bulging.     Left Ear: Hearing, tympanic membrane, ear canal and external ear normal. No tenderness.  No middle ear effusion. There is no impacted cerumen. No mastoid tenderness. Tympanic membrane is not perforated, erythematous, retracted or bulging.     Mouth/Throat:     Pharynx: No posterior oropharyngeal erythema.  Eyes:     General: Lids are normal. Lids are everted, no foreign bodies appreciated. Vision grossly intact. Gaze aligned appropriately. No visual field deficit.       Right eye: No foreign body, discharge or hordeolum.        Left eye: No foreign body, discharge or hordeolum.     Extraocular Movements: Extraocular movements intact.     Right eye: No nystagmus.     Left eye: No nystagmus.     Conjunctiva/sclera:     Right eye: Right conjunctiva is not injected. No chemosis, exudate or hemorrhage.    Left eye: Left conjunctiva is not injected. No chemosis, exudate or hemorrhage.    Pupils: Pupils are equal, round, and reactive to light. Pupils are equal.     Right eye: No corneal abrasion or fluorescein uptake. Seidel exam negative.     Left eye: No corneal abrasion or fluorescein uptake. Seidel exam negative.    Visual Fields: Right eye visual fields normal and left eye visual fields normal.     Comments: Visual acuity grossly intact. Fields full to confrontation.  PERRLA. EOMI without pain. No proptosis. Anterior chamber, conjunctivae, sclera all without hemorrhage or visible injury  Cardiovascular:     Rate and Rhythm: Normal rate and regular rhythm.     Heart sounds: Normal heart sounds.  Pulmonary:     Effort: Pulmonary effort is normal.     Breath sounds: Normal breath sounds and air entry.  Lymphadenopathy:     Cervical: No cervical adenopathy.  Neurological:     General: No focal deficit present.     Mental Status: He is alert and  oriented to person, place, and time.  Psychiatric:        Attention and Perception: Attention and perception normal.        Mood and Affect: Mood and affect normal.     UC Treatments / Results  Labs (all labs ordered are listed, but  only abnormal results are displayed) Labs Reviewed - No data to display  EKG   Radiology No results found.  Procedures Procedures (including critical care time)  Medications Ordered in UC Medications - No data to display  Initial Impression / Assessment and Plan / UC Course  I have reviewed the triage vital signs and the nursing notes.  Pertinent labs & imaging results that were available during my care of the patient were reviewed by me and considered in my medical decision making (see chart for details).     This patient is a very pleasant 40 y.o. year old male presenting with diplopia x1 week after stroke-like episode. Initially with speech difficulties (at home one week ago) though these seem to have resolved. Neuro exam is reassuring. Given persistent diplopia, advised him to head straight to the ED for brain scan. Did not call code stroke as it has been 1 week of symptoms. It does appear that this patient was advised by his PCP to head straight to the ED, he unfortunately decided to present to this urgent care instead. Sent to ED via personal vehicle, declines transport via EMS.   Final Clinical Impressions(s) / UC Diagnoses   Final diagnoses:  Monocular diplopia, right eye     Discharge Instructions      I am very concerned that you have had a stroke. Please head straight to the ED.   ED Prescriptions   None    PDMP not reviewed this encounter.   Hazel Sams, PA-C 01/12/21 1527

## 2021-01-13 ENCOUNTER — Emergency Department (HOSPITAL_COMMUNITY): Payer: Self-pay

## 2021-01-13 IMAGING — MR MR HEAD WO/W CM
6 of 12 series · 26 of 48 positions shown · IV contrast (gadavist)
Comparison: No prior MRI, correlation is made with CT head
[DATE]

CLINICAL DATA: Neuro deficit, stroke suspected, diplopia

EXAM:
MRI HEAD AND ORBITS WITHOUT AND WITH CONTRAST
TECHNIQUE: Multiplanar, multiecho pulse sequences of the brain and surrounding
structures were obtained without and with intravenous contrast.
Multiplanar, multiecho pulse sequences of the orbits and surrounding
structures were obtained including fat saturation techniques, before
and after intravenous contrast administration.
CONTRAST:  10mL GADAVIST GADOBUTROL 1 MMOL/ML IV SOLN

[Series 2: DWI · axial · 3.0mm · 0.94mm/px · z∈[-79,+61]mm · 8 of 96 slices shown (1 of 2)]
[im 1/96]
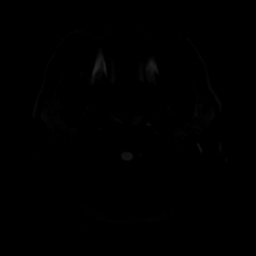
[im 14/96]
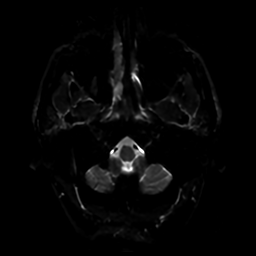
[im 28/96]
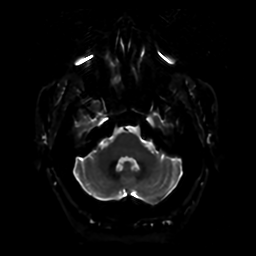
[im 41/96]
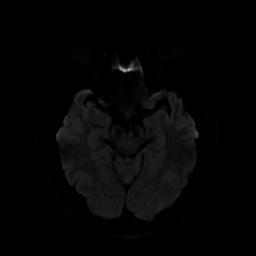
[im 55/96]
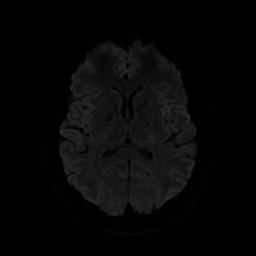
[im 68/96]
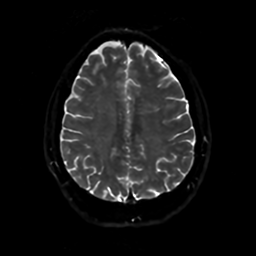
[im 82/96]
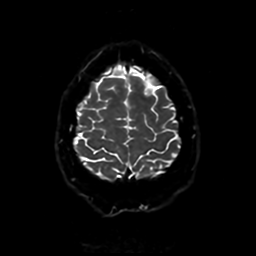
[im 96/96]
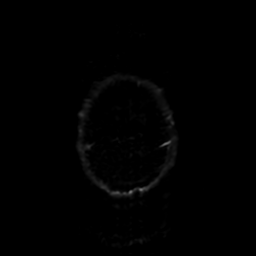

[Series 3: DWI · coronal · 4.0mm · 0.94mm/px · 6 of 70 slices shown (2 of 2)]
[im 1/70]
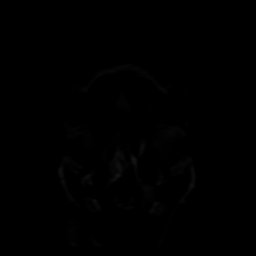
[im 14/70]
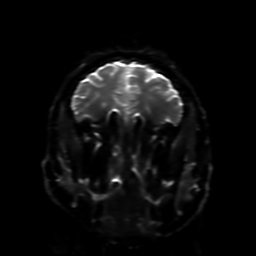
[im 28/70]
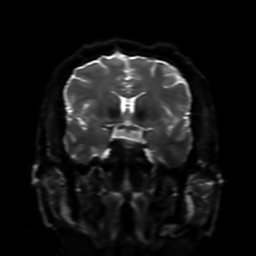
[im 42/70]
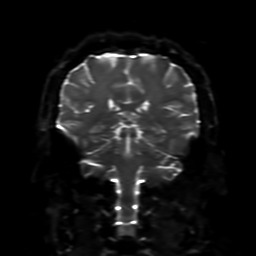
[im 56/70]
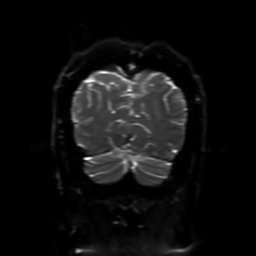
[im 70/70]
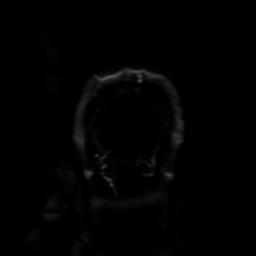

[Series 4: FLAIR · sagittal · 5.0mm · 0.23mm/px · 2 of 25 slices shown (1 of 2)]
[im 1/25]
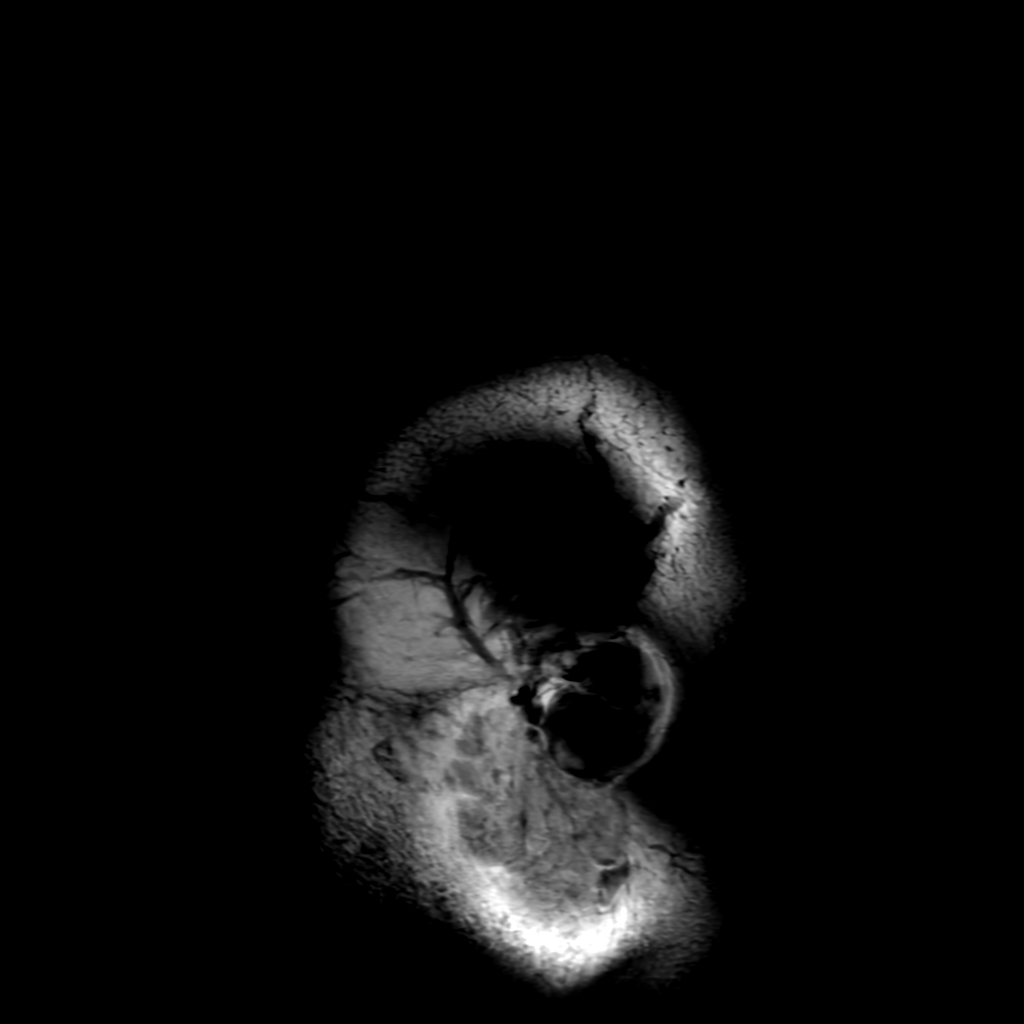
[im 25/25]
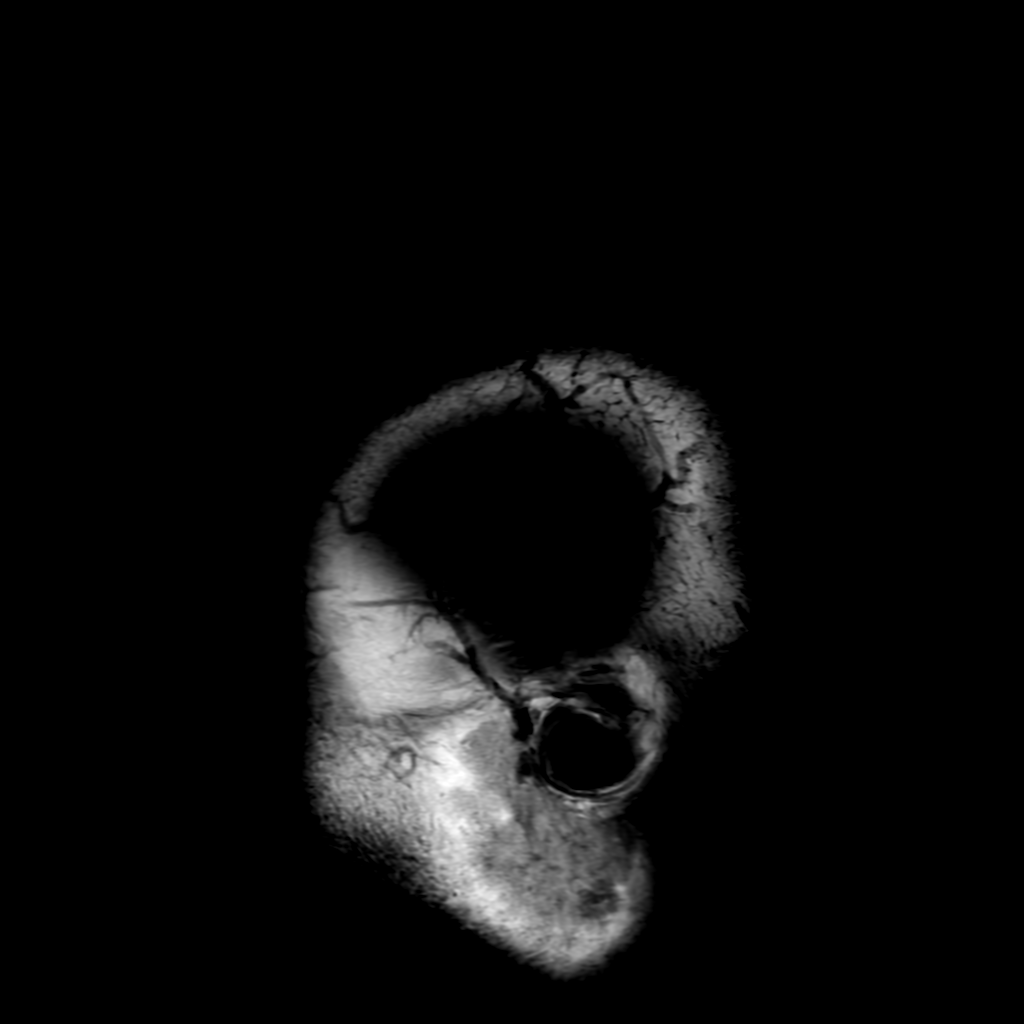

[Series 5: FLAIR · axial · 4.0mm · 0.45mm/px · z∈[-81,+59]mm · 3 of 33 slices shown (2 of 2)]
[im 1/33]
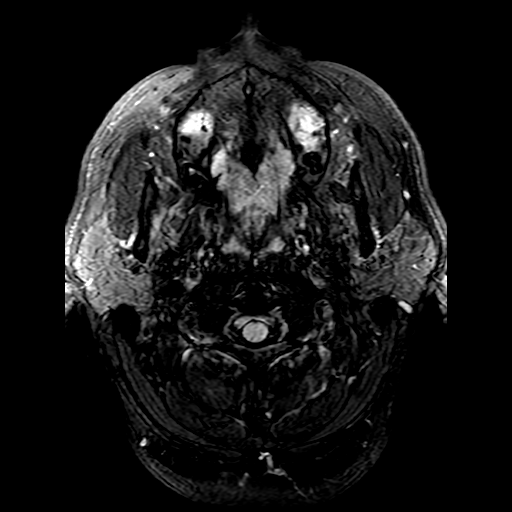
[im 17/33]
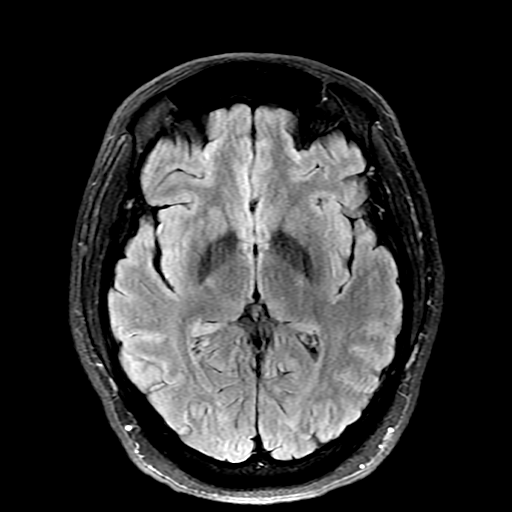
[im 33/33]
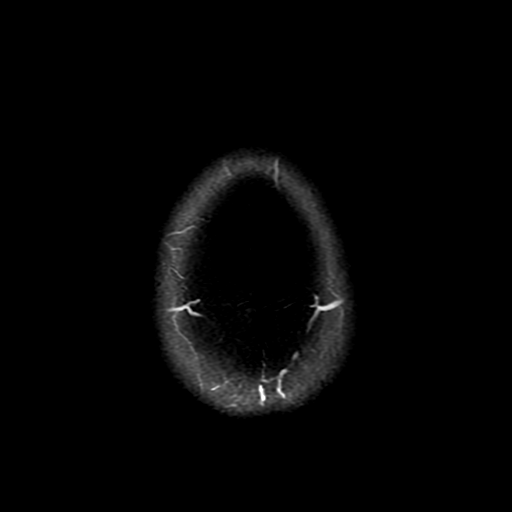

[Series 250: ADC · axial · 3.0mm · 0.94mm/px · z∈[-79,+61]mm · 4 of 48 slices shown (1 of 2)]
[im 1/48]
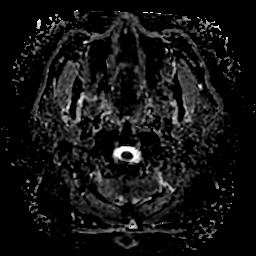
[im 16/48]
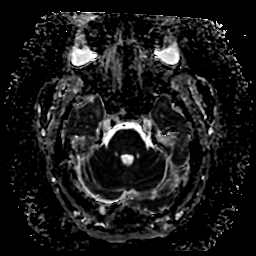
[im 32/48]
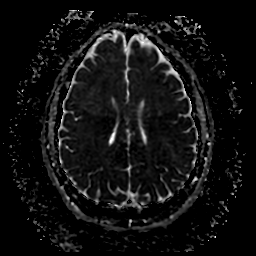
[im 48/48]
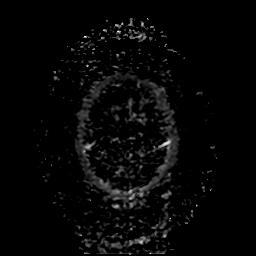

[Series 350: ADC · coronal · 4.0mm · 0.94mm/px · 3 of 35 slices shown (2 of 2)]
[im 1/35]
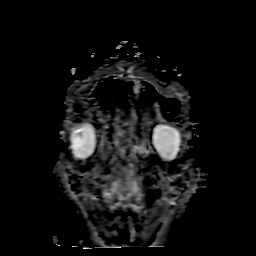
[im 18/35]
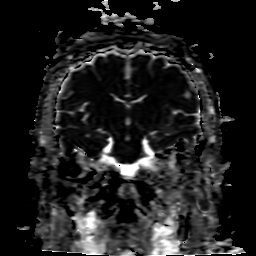
[im 35/35]
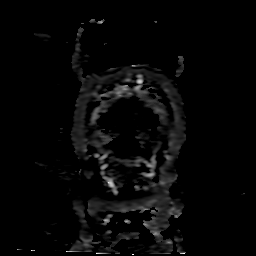

[26 of 48 positions shown; findings below may reference images not displayed]

FINDINGS: MRI HEAD FINDINGS

Brain: No restricted diffusion to suggest acute infarct. No acute
hemorrhage, mass, mass effect, or midline shift. No abnormal
enhancement. No foci of susceptibility to suggest remote hemorrhage.

Vascular: Normal flow voids.

Skull and upper cervical spine: Normal marrow signal.

Other: The mastoids are well aerated.

MRI ORBITS FINDINGS

Orbits: No traumatic or inflammatory finding. Globes, optic nerves,
orbital fat, extraocular muscles, vascular structures, and lacrimal
glands are normal.

Visualized sinuses: Small mucous retention cysts in the maxillary
sinuses.

Soft tissues: Negative.
IMPRESSION: No acute intracranial or intraorbital process. No etiology is seen
for the patient's diplopia.

## 2021-01-13 IMAGING — MR MR ORBITS WO/W CM
4 of 7 series · 19 of 48 positions shown · IV contrast (cc gad)
Comparison: No prior MRI, correlation is made with CT head
[DATE]

CLINICAL DATA: Neuro deficit, stroke suspected, diplopia

EXAM:
MRI HEAD AND ORBITS WITHOUT AND WITH CONTRAST
TECHNIQUE: Multiplanar, multiecho pulse sequences of the brain and surrounding
structures were obtained without and with intravenous contrast.
Multiplanar, multiecho pulse sequences of the orbits and surrounding
structures were obtained including fat saturation techniques, before
and after intravenous contrast administration.
CONTRAST:  10mL GADAVIST GADOBUTROL 1 MMOL/ML IV SOLN

[Series 9: T2 fat-sat · coronal · 4.0mm · 0.35mm/px · 9 of 27 slices shown (1 of 2)]
[im 1/27]
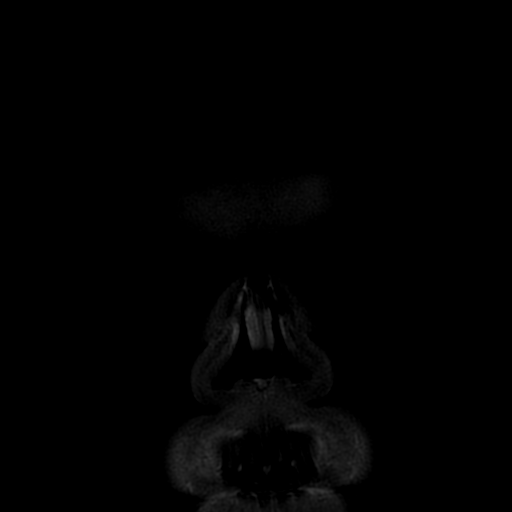
[im 4/27]
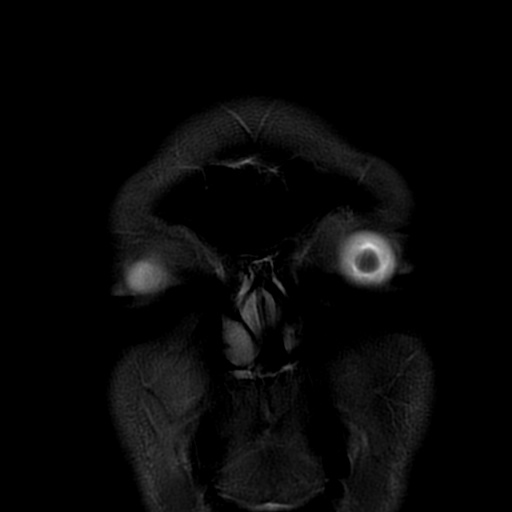
[im 7/27]
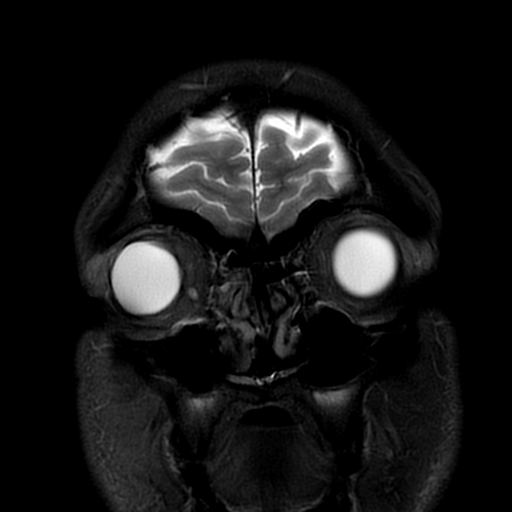
[im 10/27]
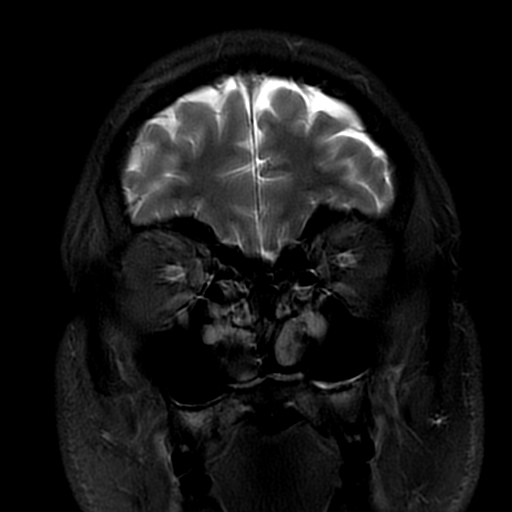
[im 14/27]
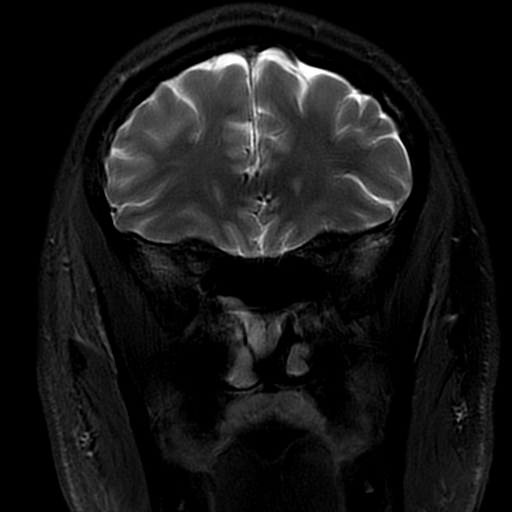
[im 17/27]
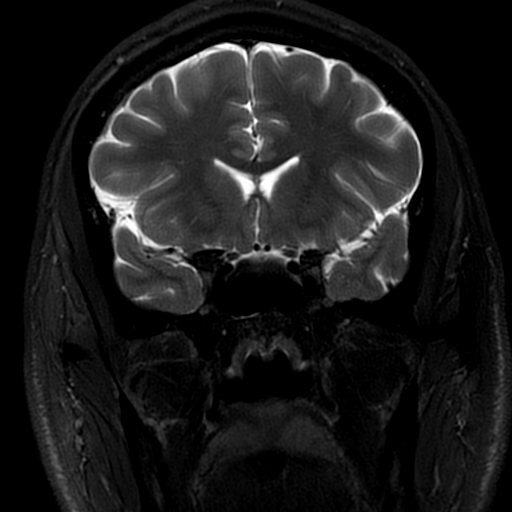
[im 20/27]
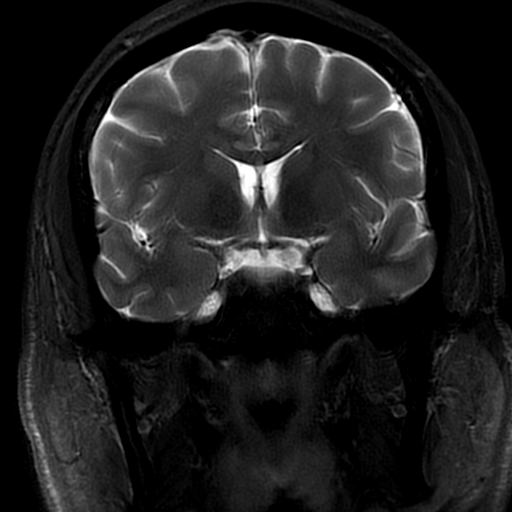
[im 23/27]
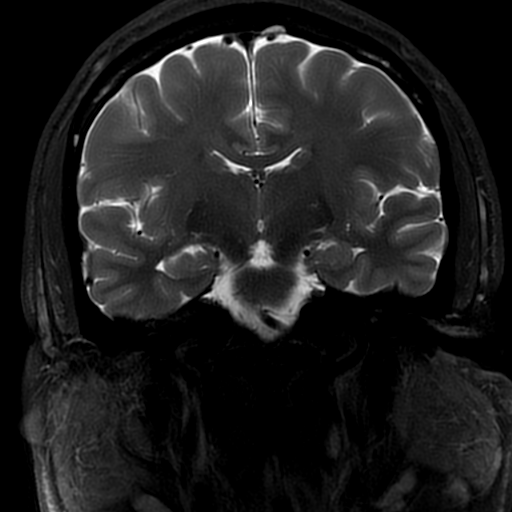
[im 27/27]
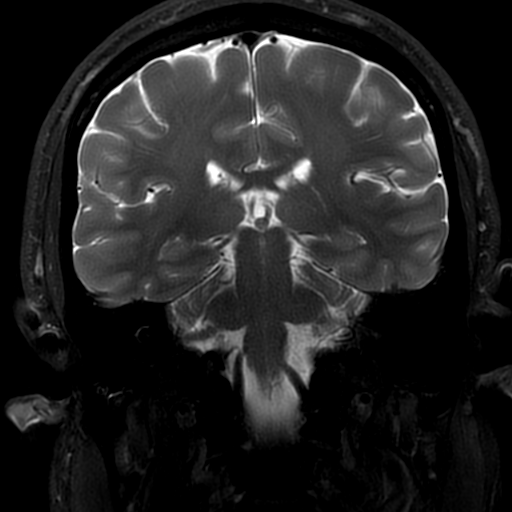

[Series 10: T2 fat-sat · axial · 3.0mm · 0.35mm/px · z∈[-59,-3]mm · 4 of 20 slices shown (2 of 2)]
[im 1/20]
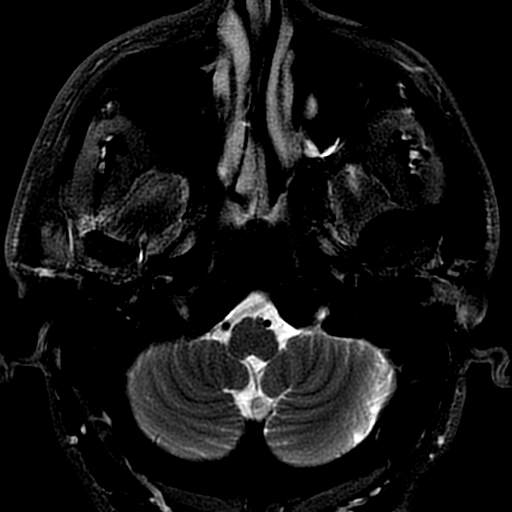
[im 4/20]
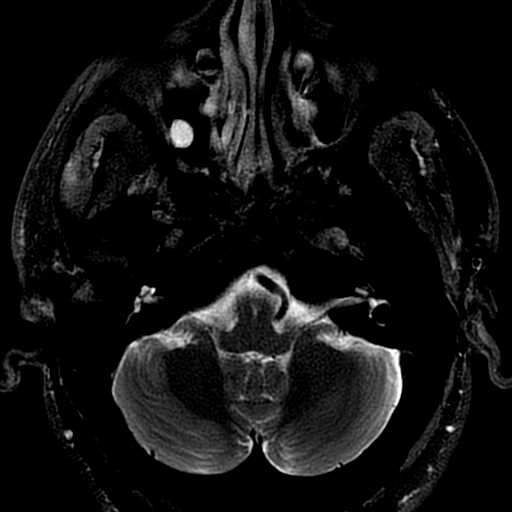
[im 12/20]
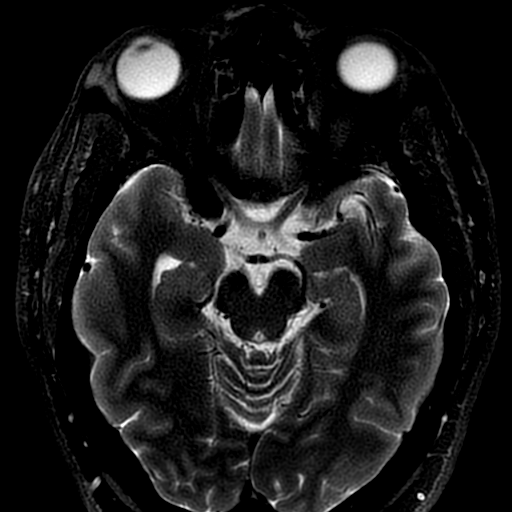
[im 20/20]
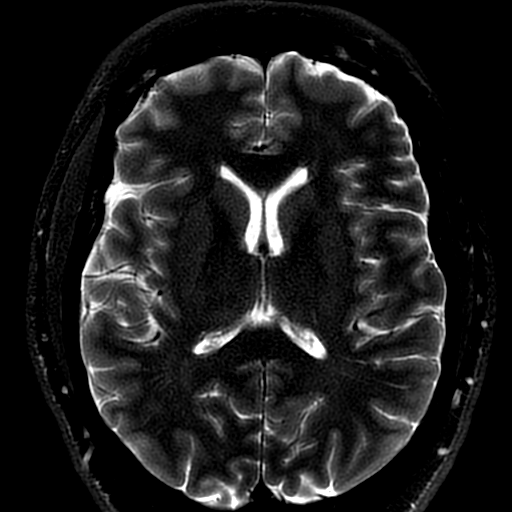

[Series 11: T1 · coronal · 4.0mm · 0.35mm/px · 3 of 27 slices shown (1 of 2)]
[im 4/27]
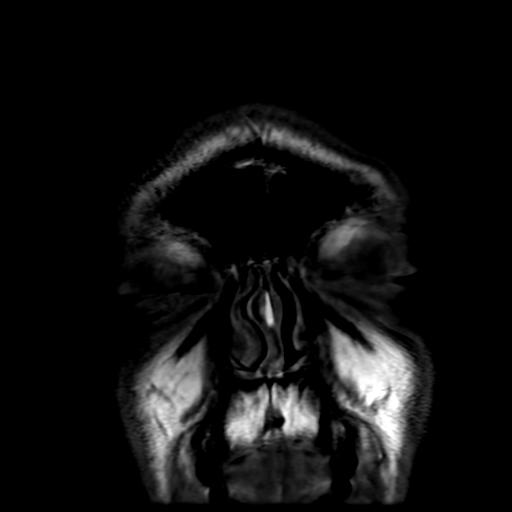
[im 15/27]
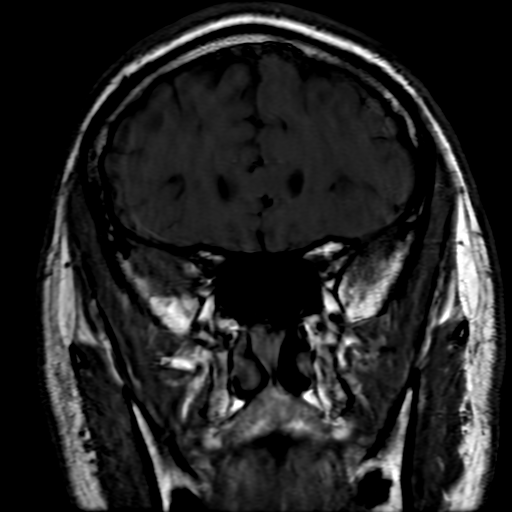
[im 23/27]
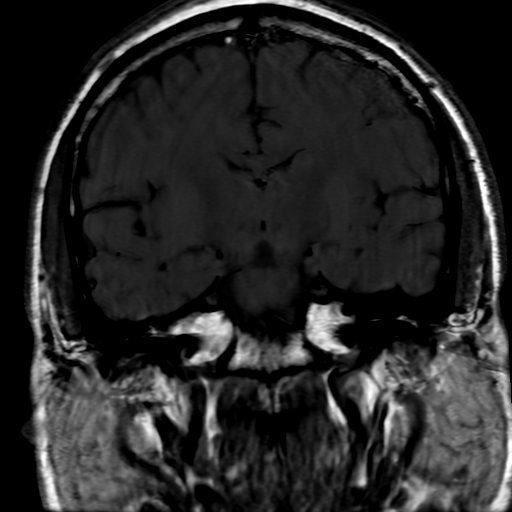

[Series 12: T1 · axial · 3.0mm · 0.35mm/px · z∈[-50,-3]mm · 3 of 20 slices shown (2 of 2)]
[im 4/20]
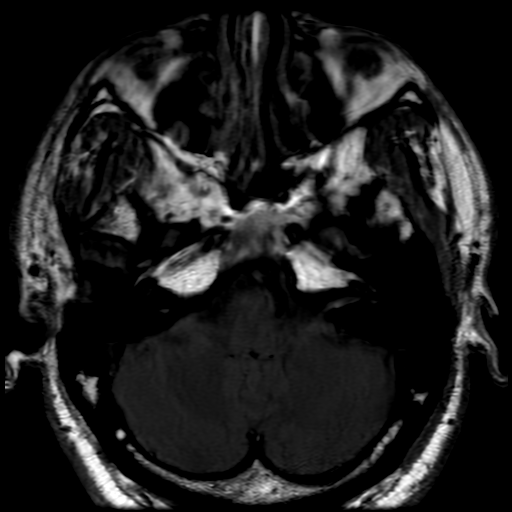
[im 12/20]
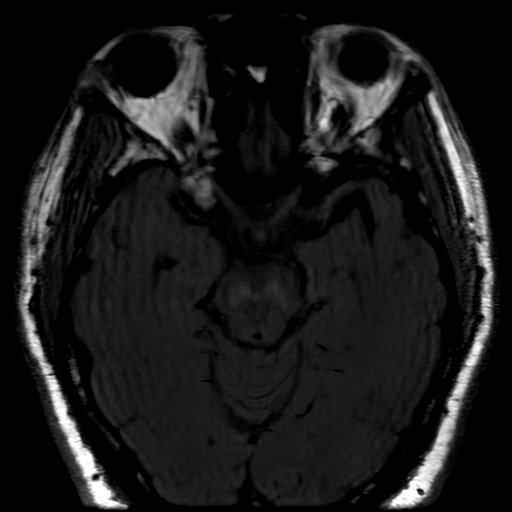
[im 20/20]
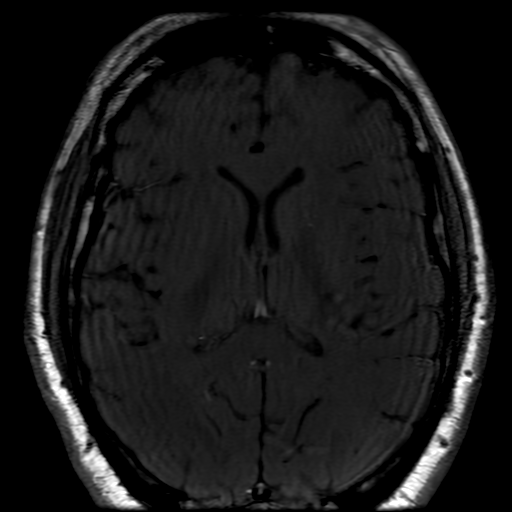

[19 of 48 positions shown; findings below may reference images not displayed]

FINDINGS: MRI HEAD FINDINGS

Brain: No restricted diffusion to suggest acute infarct. No acute
hemorrhage, mass, mass effect, or midline shift. No abnormal
enhancement. No foci of susceptibility to suggest remote hemorrhage.

Vascular: Normal flow voids.

Skull and upper cervical spine: Normal marrow signal.

Other: The mastoids are well aerated.

MRI ORBITS FINDINGS

Orbits: No traumatic or inflammatory finding. Globes, optic nerves,
orbital fat, extraocular muscles, vascular structures, and lacrimal
glands are normal.

Visualized sinuses: Small mucous retention cysts in the maxillary
sinuses.

Soft tissues: Negative.
IMPRESSION: No acute intracranial or intraorbital process. No etiology is seen
for the patient's diplopia.

## 2021-01-13 MED ORDER — SODIUM CHLORIDE 0.9 % IV BOLUS
500.0000 mL | Freq: Once | INTRAVENOUS | Status: AC
  Administered 2021-01-13: 500 mL via INTRAVENOUS

## 2021-01-13 MED ORDER — LORAZEPAM 2 MG/ML IJ SOLN
1.0000 mg | Freq: Once | INTRAMUSCULAR | Status: AC
  Administered 2021-01-13: 1 mg via INTRAVENOUS
  Filled 2021-01-13: qty 1

## 2021-01-13 MED ORDER — GADOBUTROL 1 MMOL/ML IV SOLN
10.0000 mL | Freq: Once | INTRAVENOUS | Status: AC | PRN
  Administered 2021-01-13: 10 mL via INTRAVENOUS

## 2021-01-13 NOTE — Discharge Instructions (Addendum)
It was a pleasure taking care of you today.  As discussed, your MRIs did not show any abnormalities.  I have included the number of the eye doctor.  Please call tomorrow to schedule an appointment for further evaluation.  Return to the ER for any worsening symptoms.

## 2021-01-13 NOTE — ED Provider Notes (Signed)
[place] EMERGENCY DEPARTMENT Provider Note   CSN: [id number] Arrival date & time: 01/12/21  1530     History No chief complaint on file.   Walter Richardson is a 40 y.o. male who reports a history of hypertension otherwise healthy presented for diplopia.  Patient reports that over the past week he has noticed double vision.  Has been constant since onset he reports seeing 2 of everything whenever he first looks at something and he has to try and focus his eyes to bring the images together.  This has not worsened over the past week, patient has never experienced this in the past.  Patient does report that around 1 week ago he did have a slight cough and sore throat which she attributed to a viral illness and that has resolved.  Additionally patient reports around 6-7 months ago he noticed a feeling of difficulty speaking which was brief and has and patient reports that briefly recurred around 1 week ago.  He denies any numbness, tingling, weakness, difficulty walking, headache or any additional concerns.  HPI     No past medical history on file.  There are no problems to display for this patient.   Past Surgical History:  Procedure Laterality Date   FRACTURE SURGERY         Family History  Family history unknown: Yes    Social History   Tobacco Use   Smoking status: Every Day    Types: Cigarettes   Smokeless tobacco: Never  Vaping Use   Vaping Use: Never used  Substance Use Topics   Alcohol use: Yes    Comment: occasional   Drug use: No    Home Medications Prior to Admission medications   Medication Sig Start Date End Date Taking? Authorizing Provider  aspirin 325 MG tablet Take by mouth.    [provider]  hydrocortisone-pramoxine (ANALPRAM HC) 2.5-1 % rectal cream Place 1 application rectally 3 (three) times daily. 07/26/16   Janne Napoleon, NP  ibuprofen (ADVIL) 800 MG tablet Take 1 tablet (800 mg total) by mouth every 8 (eight) hours  as needed (fever and/or body aches). 01/22/19   Corena Herter, PA-C  losartan-hydrochlorothiazide (HYZAAR) 50-12.5 MG tablet Take 1 tablet by mouth daily. 09/20/19   [provider]  naproxen (NAPROSYN) 375 MG tablet Take 1 tablet (375 mg total) by mouth 2 (two) times daily. 09/26/20   Raspet, Derry Skill, PA-C  Omega-3 Fatty Acids (FISH OIL) 1000 MG CAPS Take by mouth.    [provider]  OVER THE COUNTER MEDICATION Take 1 tablet by mouth daily. OTC Weight loss pill    [provider]  promethazine (PHENERGAN) 25 MG tablet Take 1 tablet (25 mg total) by mouth every 6 (six) hours as needed for nausea. 04/18/12   Leonard Schwartz, MD  traMADol (ULTRAM) 50 MG tablet Take 1 tablet (50 mg total) by mouth every 6 (six) hours as needed. 07/26/16   Janne Napoleon, NP    Allergies    Patient has no known allergies.  Review of Systems   Review of Systems Ten systems are reviewed and are negative for acute change except as noted in the HPI  Physical Exam Updated Vital Signs BP (!) 140/97 (BP Location: Left Arm)   Pulse 66   Temp 98.3 F (36.8 C) (Oral)   Resp 16   SpO2 98%   Physical Exam Constitutional:      General: He is not in acute distress.  Appearance: Normal appearance. He is well-developed. He is not ill-appearing or diaphoretic.  HENT:     Head: Normocephalic and atraumatic.  Eyes:     General: Vision grossly intact.     Conjunctiva/sclera: Conjunctivae normal.     Pupils: Pupils are equal, round, and reactive to light.  Neck:     Trachea: Trachea and phonation normal.  Pulmonary:     Effort: Pulmonary effort is normal. No respiratory distress.  Abdominal:     General: There is no distension.     Palpations: Abdomen is soft.     Tenderness: There is no abdominal tenderness. There is no guarding or rebound.  Musculoskeletal:        General: Normal range of motion.     Cervical back: Normal range of motion.  Skin:    General: Skin is warm and dry.   Neurological:     Mental Status: He is alert.     GCS: GCS eye subscore is 4. GCS verbal subscore is 5. GCS motor subscore is 6.     Comments: Mental Status:  Alert, oriented, thought content appropriate, able to give a coherent history. Speech fluent without evidence of aphasia. Able to follow 2 step commands without difficulty.  Cranial Nerves:  II:  Peripheral visual fields grossly normal, pupils equal, round, reactive to light III,IV, VI: No proptosis.  Patient does appear to have difficulty maintaining slight exotropia with the right eye, abducens nerve appears intact. V,VII: smile symmetric, eyebrows raise symmetric, facial light touch sensation equal VIII: hearing grossly normal to voice  X: uvula elevates symmetrically  XI: bilateral shoulder shrug symmetric and strong XII: midline tongue extension without fassiculations Motor:  Normal tone. 5/5 strength in upper and lower extremities bilaterally including strong and equal grip strength and dorsiflexion/plantar flexion Sensory: Sensation intact to light touch in all extremities. Negative Romberg.  Cerebellar: normal finger-to-nose with bilateral upper extremities. Normal heel-to -shin balance bilaterally of the lower extremity. No pronator drift.  Gait: normal gait and balance CV: distal pulses palpable throughout  Psychiatric:        Behavior: Behavior normal.    ED Results / Procedures / Treatments   Labs (all labs ordered are listed, but only abnormal results are displayed) Labs Reviewed  CBC - Abnormal; Notable for the following components:      Result Value   Platelets 893 (*)    All other components within normal limits  DIFFERENTIAL - Abnormal; Notable for the following components:   Basophils Absolute 0.2 (*)    All other components within normal limits  COMPREHENSIVE METABOLIC PANEL - Abnormal; Notable for the following components:   Glucose, Bld 68 (*)    ALT 62 (*)    All other components within normal limits   I-STAT CHEM 8, ED - Abnormal; Notable for the following components:   Calcium, Ion 0.99 (*)    Hemoglobin 17.3 (*)    All other components within normal limits  PROTIME-INR  APTT  CBG MONITORING, ED    EKG None  Radiology CT HEAD WO CONTRAST  Result Date: 01/12/2021 CLINICAL DATA:  Neuro deficit, acute, stroke suspected EXAM: CT HEAD WITHOUT CONTRAST TECHNIQUE: Contiguous axial images were obtained from the base of the skull through the vertex without intravenous contrast. COMPARISON:  None. FINDINGS: Brain: No evidence of large-territorial acute infarction. No parenchymal hemorrhage. No mass lesion. No extra-axial collection. No mass effect or midline shift. No hydrocephalus. Basilar cisterns are patent. Vascular: No hyperdense vessel. Skull: No acute  fracture or focal lesion. Sinuses/Orbits: Coastal thickening/polyps within the maxillary sinuses. Paranasal sinuses and mastoid air cells are clear. The orbits are unremarkable. Other: None. IMPRESSION: No acute intracranial abnormality. Electronically Signed   By: Iven Finn M.D.   On: 01/12/2021 18:59    Procedures Procedures   Medications Ordered in ED Medications  sodium chloride 0.9 % bolus 500 mL (has no administration in time range)    ED Course  I have reviewed the triage vital signs and the nursing notes.  Pertinent labs & imaging results that were available during my care of the patient were reviewed by me and considered in my medical decision making (see chart for details).  Clinical Course as of 01/13/21 1526  Tue Jan 13, 2021  1451 MRI consult neuro after [BM]    Clinical Course User Index [BM] Gari Crown   MDM Rules/Calculators/A&P                           Additional history obtained from: Nursing notes from this visit. Review of electronic medical records. --- 40 year old male history of hypertension presented today for evaluation of diplopia.  He does also report a brief episode of  difficulty speaking a week ago but that appears to have resolved. On my examination patient appears to have a slight 3rd nerve palsy of the right eye but no proptosis.  Patient's triage work-up included a CT head which was negative for acute findings.  I discussed the case with neurologist Dr. Rory Percy who advised obtaining MRI with and without contrast of the brain and orbits for further evaluation. ----- Patient returned from MRI, was too anxious.  I have ordered Ativan for the patient.  Patient is agreeable to try again for MRI.  Care handoff given to [facility] abdomen PA-C at shift change.  Plan of care is to follow-up on MRI and reconsult neuro.   Note: Portions of this report may have been transcribed using voice recognition software. Every effort was made to ensure accuracy; however, inadvertent computerized transcription errors may still be present.  Final Clinical Impression(s) / ED Diagnoses Final diagnoses:  None    Rx / DC Orders ED Discharge Orders     None        Gari Crown 01/13/21 1526    Blanchie Dessert, MD 01/19/21 1322

## 2021-01-13 NOTE — ED Notes (Signed)
Patient states he wants to leave since his MRIs are negative. Informed the pt that we are waiting for neurology to speak with the EDP and clear him. Pt expressed his frustration and asked if his IV could be removed. Informed the pt that the EDP would like the pt to keep the IV just in case the pt is admitted. Pt acknowledged.

## 2021-01-13 NOTE — ED Notes (Signed)
Pt did not tolerate MRI, and does not want to try again with medication.

## 2021-01-13 NOTE — ED Notes (Signed)
Care assumed. Pt currently in MRI.

## 2021-01-13 NOTE — ED Provider Notes (Signed)
Care assumed from [facility], [place] at shift change pending MRI orbit/brain. See his note for full HPI.  In short, patient is a 40 year old male who presents to the ED due to diplopia x1 week.  Patient's diplopia was preceded by a viral-like illness.  Roughly 6 to 7 months ago patient noticed difficulty speaking which was brief however, recurred roughly 1 week ago.  No history of previous CVA.  Plan from previous provider: follow-up on MRI images and discuss results with neurology.   ED Course/Procedures   Clinical Course as of 01/13/21 1817  Tue Jan 13, 2021  1451 MRI consult neuro after [BM]  1705 MR ORBITS W WO CONTRAST [PM]    Clinical Course User Index [BM] Deliah Boston, PA-C [PM] Valarie Merino, MD   Results for orders placed or performed during the hospital encounter of 01/12/21 (from the past 24 hour(s))  I-stat chem 8, ED     Status: Abnormal   Collection Time: 01/12/21  5:48 PM  Result Value Ref Range   Sodium 139 135 - 145 mmol/L   Potassium 3.7 3.5 - 5.1 mmol/L   Chloride 105 98 - 111 mmol/L   BUN 19 6 - 20 mg/dL   Creatinine, Ser 1.00 0.61 - 1.24 mg/dL   Glucose, Bld 70 70 - 99 mg/dL   Calcium, Ion 0.99 (L) 1.15 - 1.40 mmol/L   TCO2 25 22 - 32 mmol/L   Hemoglobin 17.3 (H) 13.0 - 17.0 g/dL   HCT 51.0 39.0 - 52.0 %  Protime-INR     Status: None   Collection Time: 01/12/21  5:54 PM  Result Value Ref Range   Prothrombin Time 13.2 11.4 - 15.2 seconds   INR 1.0 0.8 - 1.2  APTT     Status: None   Collection Time: 01/12/21  5:54 PM  Result Value Ref Range   aPTT 32 24 - 36 seconds  CBC     Status: Abnormal   Collection Time: 01/12/21  5:54 PM  Result Value Ref Range   WBC 8.7 4.0 - 10.5 K/uL   RBC 5.71 4.22 - 5.81 MIL/uL   Hemoglobin 16.9 13.0 - 17.0 g/dL   HCT 51.6 39.0 - 52.0 %   MCV 90.4 80.0 - 100.0 fL   MCH 29.6 26.0 - 34.0 pg   MCHC 32.8 30.0 - 36.0 g/dL   RDW 14.0 11.5 - 15.5 %   Platelets 893 (H) 150 - 400 K/uL   nRBC 0.0 0.0 - 0.2 %   Differential     Status: Abnormal   Collection Time: 01/12/21  5:54 PM  Result Value Ref Range   Neutrophils Relative % 58 %   Neutro Abs 5.1 1.7 - 7.7 K/uL   Lymphocytes Relative 26 %   Lymphs Abs 2.3 0.7 - 4.0 K/uL   Monocytes Relative 11 %   Monocytes Absolute 0.9 0.1 - 1.0 K/uL   Eosinophils Relative 3 %   Eosinophils Absolute 0.2 0.0 - 0.5 K/uL   Basophils Relative 2 %   Basophils Absolute 0.2 (H) 0.0 - 0.1 K/uL   Immature Granulocytes 0 %   Abs Immature Granulocytes 0.02 0.00 - 0.07 K/uL  Comprehensive metabolic panel     Status: Abnormal   Collection Time: 01/12/21  5:54 PM  Result Value Ref Range   Sodium 137 135 - 145 mmol/L   Potassium 3.5 3.5 - 5.1 mmol/L   Chloride 101 98 - 111 mmol/L   CO2 27 22 - 32 mmol/L  Glucose, Bld 68 (L) 70 - 99 mg/dL   BUN 17 6 - 20 mg/dL   Creatinine, Ser 1.04 0.61 - 1.24 mg/dL   Calcium 9.1 8.9 - 10.3 mg/dL   Total Protein 7.0 6.5 - 8.1 g/dL   Albumin 4.4 3.5 - 5.0 g/dL   AST 33 15 - 41 U/L   ALT 62 (H) 0 - 44 U/L   Alkaline Phosphatase 50 38 - 126 U/L   Total Bilirubin 0.9 0.3 - 1.2 mg/dL   GFR, Estimated >60 >60 mL/min   Anion gap 9 5 - 15     Procedures  MDM  Care assumed from [facility], PA-C at shift change pending MRI orbit/brain. See his note for full MDM.  40 year old male presents to the ED due to diplopia x1 week.  Patient also developed a brief episode of speech difficulties 1 week ago which resolved.  Provider had concerns about 3rd nerve palsy of right eye.  Previous provider also discussed case with Dr. Rory Percy with neurology who recommended MRI brain and orbits.   MRIs negative. No acute abnormalities. No CVA.  6:17 PM informed by RN that patient eloped from the ED while awaiting neurology recommendations. Attempted to call patient with no answer. Patient was witnessed pulling IV out of arm.           Suzy Bouchard, PA-C 01/13/21 1819    Valarie Merino, MD 01/13/21 [phone]

## 2021-01-13 NOTE — ED Notes (Signed)
Pt going to MRI

## 2021-01-13 NOTE — ED Notes (Addendum)
Patient no longer at bed. Belongings missing as well. Unable to locate pt and pt is not answering phone numbers listed in chart. Staff member, Levada Dy, stated that the pt stated he was going to remove his IV. Levada Dy told him not to do that and the pt replied, "I already did." Levada Dy witnessed the pt leave, however was unable to confirm if the IV was removed or not. Agricultural consultant and EDP made aware.

## 2021-04-21 ENCOUNTER — Emergency Department (HOSPITAL_COMMUNITY): Payer: 59

## 2021-04-21 ENCOUNTER — Encounter (HOSPITAL_COMMUNITY): Payer: Self-pay | Admitting: Emergency Medicine

## 2021-04-21 ENCOUNTER — Emergency Department (HOSPITAL_COMMUNITY)
Admission: EM | Admit: 2021-04-21 | Discharge: 2021-04-21 | Disposition: A | Discharge status: Home / Self Care | Payer: 59 | Attending: Emergency Medicine | Admitting: Emergency Medicine

## 2021-04-21 DIAGNOSIS — R1319 Other dysphagia: Secondary | ICD-10-CM | POA: Diagnosis not present

## 2021-04-21 DIAGNOSIS — Z7982 Long term (current) use of aspirin: Secondary | ICD-10-CM | POA: Diagnosis not present

## 2021-04-21 DIAGNOSIS — D75839 Thrombocytosis, unspecified: Secondary | ICD-10-CM | POA: Insufficient documentation

## 2021-04-21 DIAGNOSIS — R059 Cough, unspecified: Secondary | ICD-10-CM | POA: Diagnosis not present

## 2021-04-21 DIAGNOSIS — R1310 Dysphagia, unspecified: Secondary | ICD-10-CM | POA: Diagnosis present

## 2021-04-21 LAB — CBC WITH DIFFERENTIAL/PLATELET
Abs Immature Granulocytes: 0.02 10*3/uL (ref 0.00–0.07)
Basophils Absolute: 0.1 10*3/uL (ref 0.0–0.1)
Basophils Relative: 2 %
Eosinophils Absolute: 0.3 10*3/uL (ref 0.0–0.5)
Eosinophils Relative: 5 %
HCT: 48.7 % (ref 39.0–52.0)
Hemoglobin: 16.2 g/dL (ref 13.0–17.0)
Immature Granulocytes: 0 %
Lymphocytes Relative: 23 %
Lymphs Abs: 1.4 10*3/uL (ref 0.7–4.0)
MCH: 29.6 pg (ref 26.0–34.0)
MCHC: 33.3 g/dL (ref 30.0–36.0)
MCV: 89 fL (ref 80.0–100.0)
Monocytes Absolute: 0.5 10*3/uL (ref 0.1–1.0)
Monocytes Relative: 8 %
Neutro Abs: 3.8 10*3/uL (ref 1.7–7.7)
Neutrophils Relative %: 62 %
Platelets: 897 10*3/uL — ABNORMAL HIGH (ref 150–400)
RBC: 5.47 MIL/uL (ref 4.22–5.81)
RDW: 14.1 % (ref 11.5–15.5)
WBC: 6.1 10*3/uL (ref 4.0–10.5)
nRBC: 0 % (ref 0.0–0.2)

## 2021-04-21 LAB — BASIC METABOLIC PANEL
Anion gap: 7 (ref 5–15)
BUN: 12 mg/dL (ref 6–20)
CO2: 27 mmol/L (ref 22–32)
Calcium: 9.4 mg/dL (ref 8.9–10.3)
Chloride: 104 mmol/L (ref 98–111)
Creatinine, Ser: 1 mg/dL (ref 0.61–1.24)
GFR, Estimated: >60 mL/min (ref >60)
Glucose, Bld: 91 mg/dL (ref 70–99)
Potassium: 3.9 mmol/L (ref 3.5–5.1)
Sodium: 138 mmol/L (ref 135–145)

## 2021-04-21 IMAGING — DX DG CHEST 1V PORT
1 series · 1 of 1 positions shown · non-contrast
Comparison: View chest x-ray [DATE]

CLINICAL DATA: Dysphagia.  None discomfort swallowing.

EXAM:
PORTABLE CHEST 1 VIEW

[chest ap]
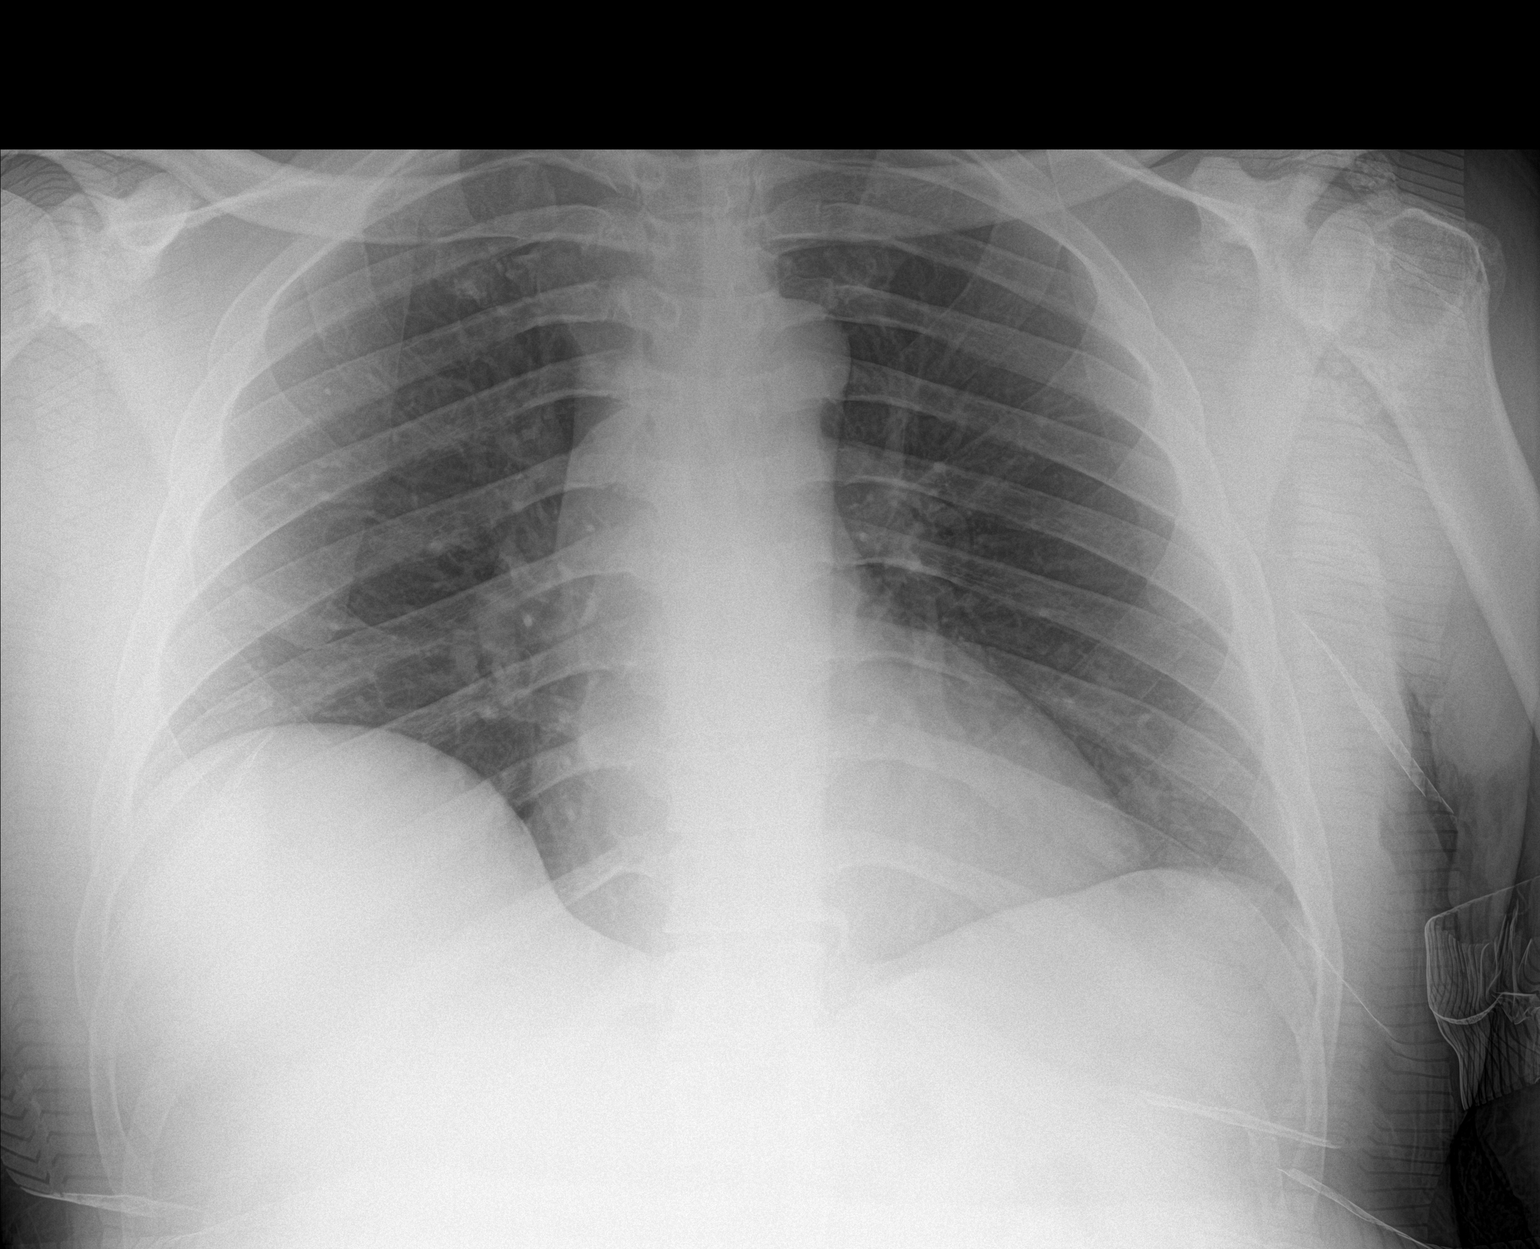

[1 of 1 positions shown; findings below may reference images not displayed]

FINDINGS: Heart size is normal. Lung volumes are low. Elevation of the right
hemidiaphragm has increased since the prior exam. Mild atelectasis
is present at both lung bases.
IMPRESSION: 1. Low lung volumes and bibasilar atelectasis.
2. Increasing elevation of the right hemidiaphragm.

## 2021-04-21 IMAGING — DX DG NECK SOFT TISSUE
2 series · 2 of 2 positions shown · non-contrast
Comparison: None.

CLINICAL DATA: Dysphagia

EXAM:
NECK SOFT TISSUES - 1+ VIEW

[neck lat]
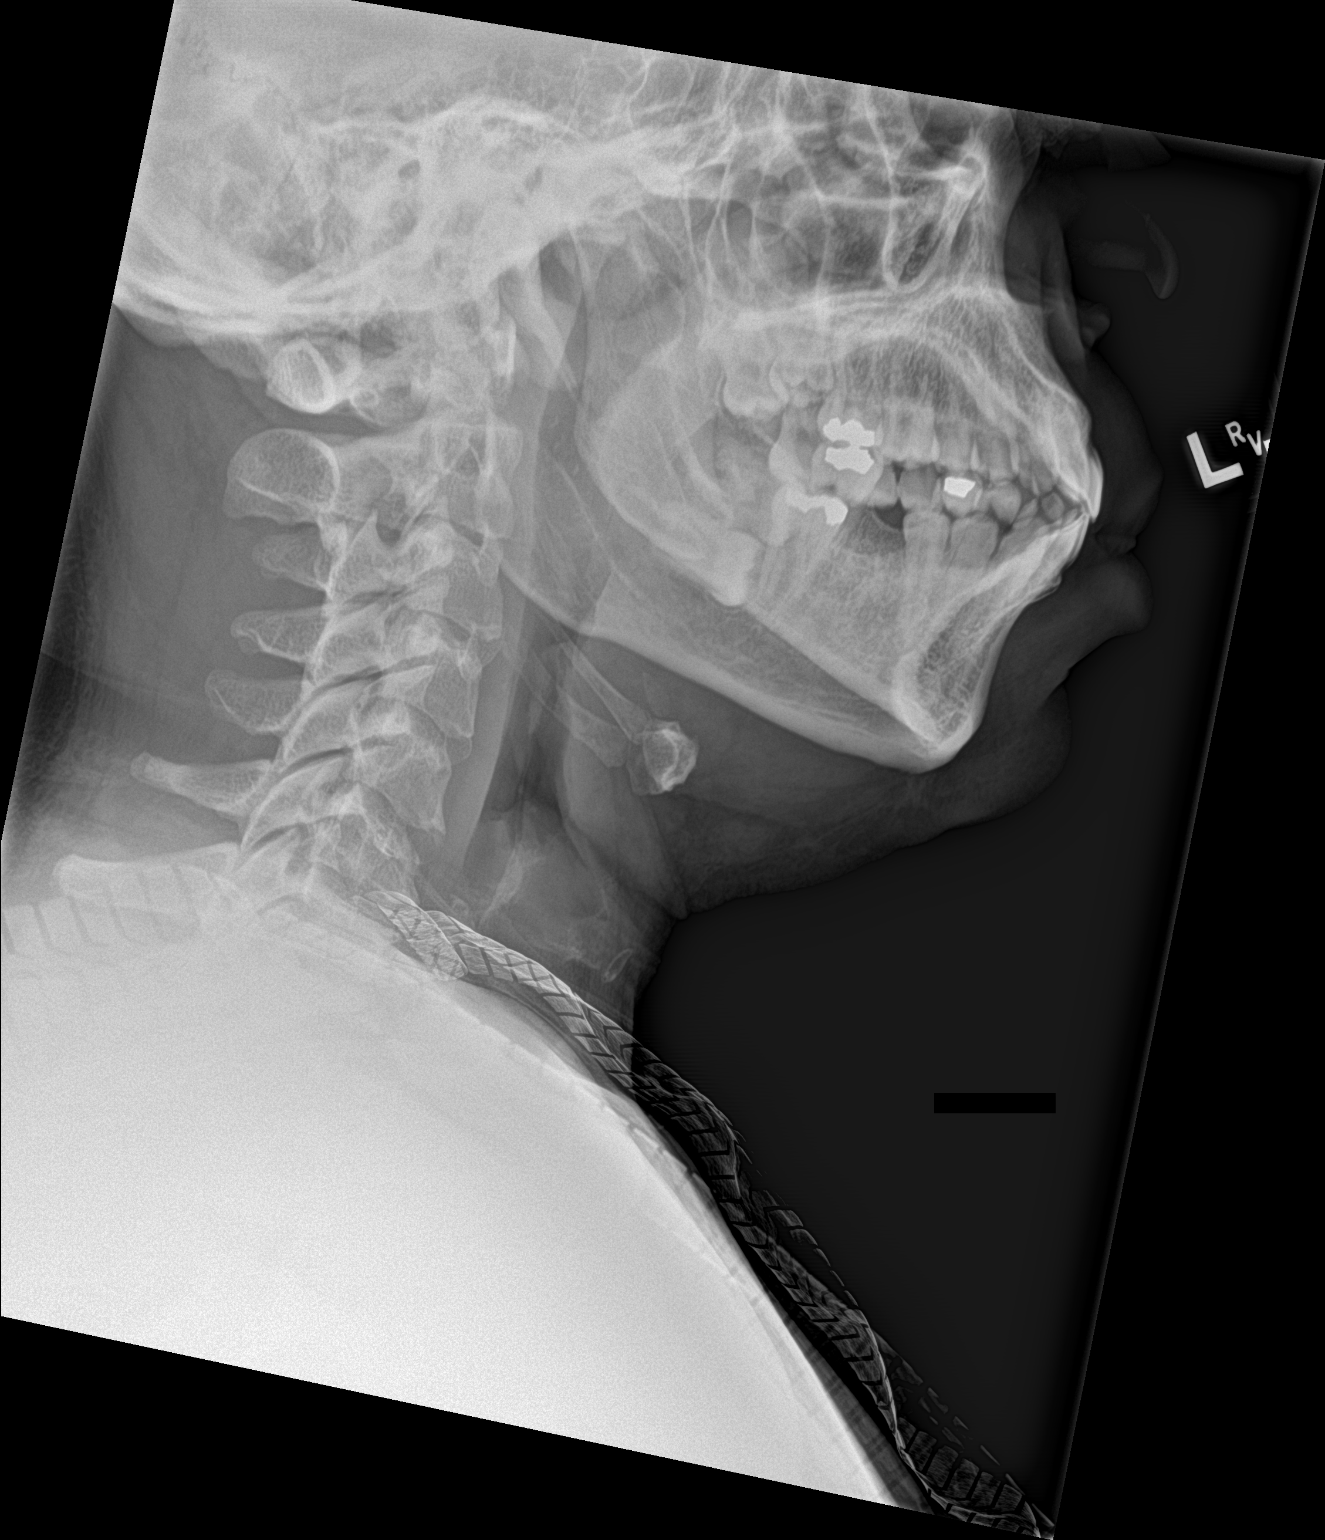

[neck ap]
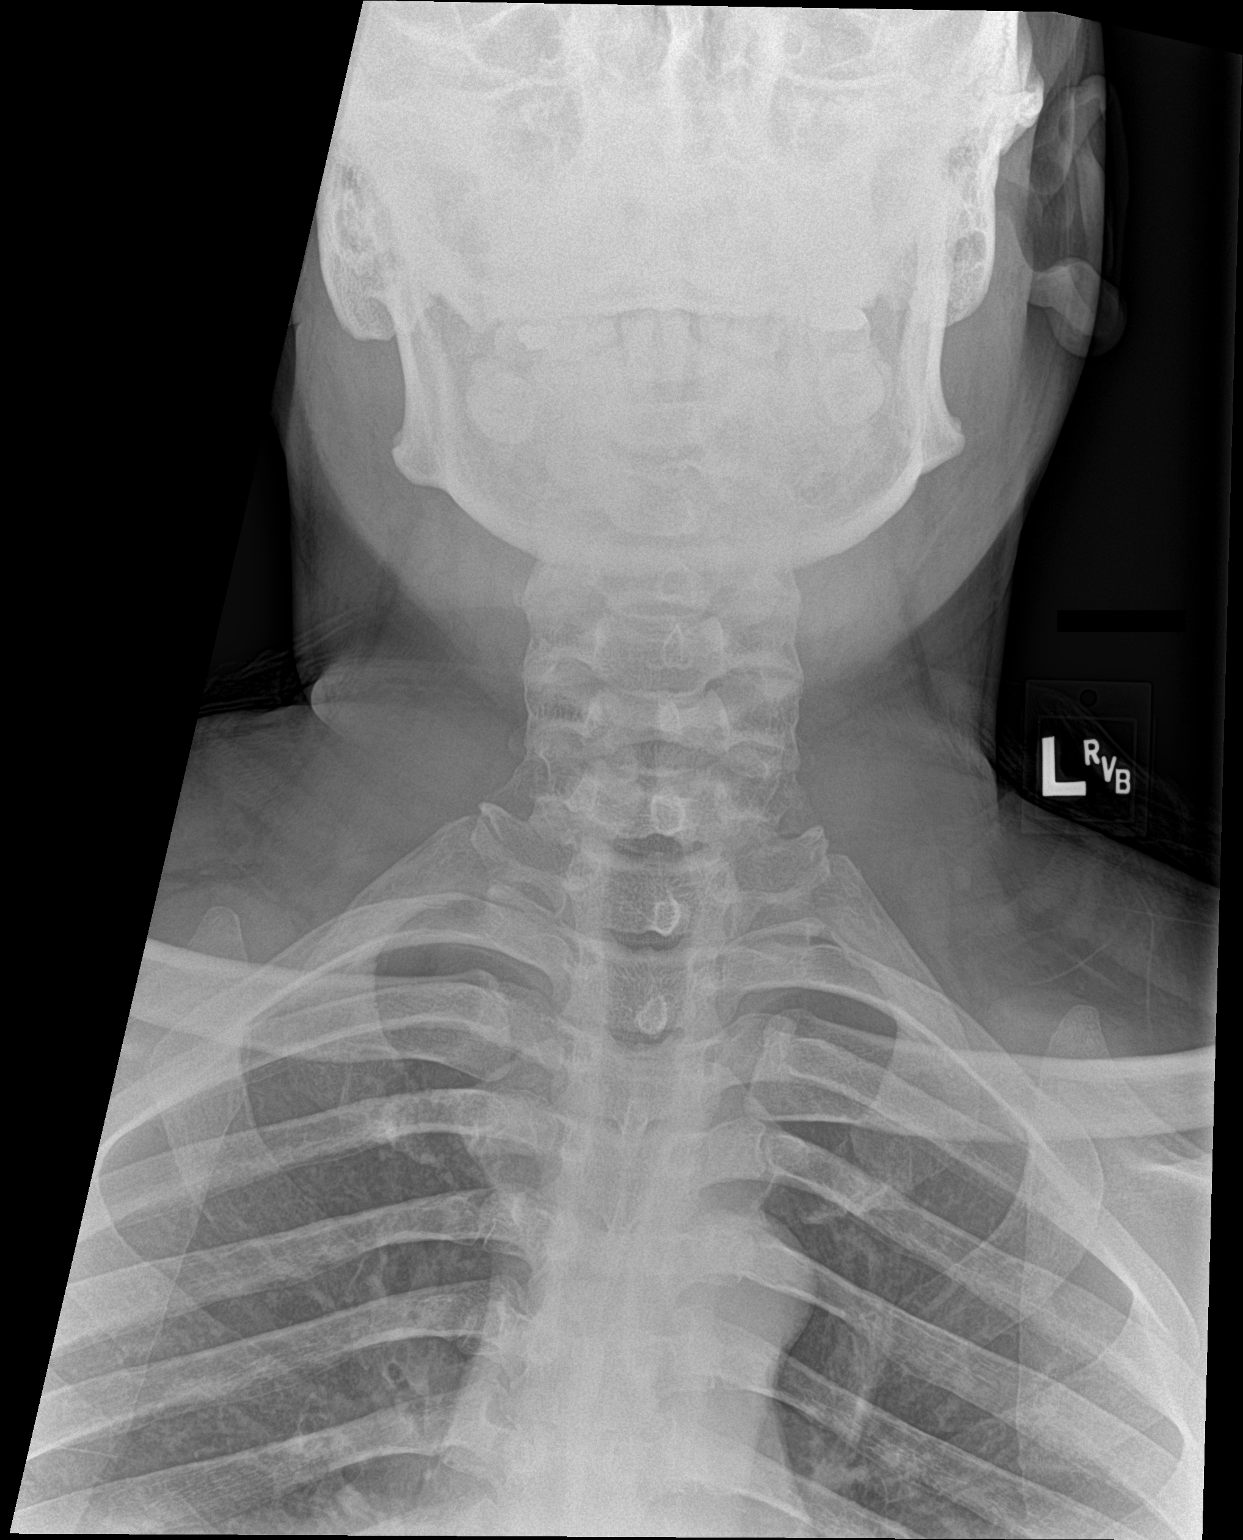

[2 of 2 positions shown; findings below may reference images not displayed]

FINDINGS: There is no evidence of retropharyngeal soft tissue swelling or
epiglottic enlargement. The cervical airway is unremarkable. There
are a few punctate mildly radiopaque foci within the oropharynx.
IMPRESSION: Few punctate mildly radiopaque foci within the oropharynx which
could represent tiny residual particles of food/liquid. No other
evidence of radiopaque foreign body. No prevertebral soft tissue
swelling.

## 2021-04-21 NOTE — ED Provider Notes (Signed)
[place] DEPT Provider Note   CSN: [id number] Arrival date & time: 04/21/21  0900     History Chief Complaint  Patient presents with   Dysphagia    Walter Richardson is a 41 y.o. male with essential thrombocytosis who presents to the emergency department with a 3-day history of dysphagia.  Patient states that he is having trouble chewing food in some instances in other instances he feels like his food and/or drink gets stuck in his throat.  Sometimes he has to cough it up.  He has tried chewing slower which does not help.  No shortness of breath or chest pain currently.  Patient does drink intermittently.  HPI     Home Medications Prior to Admission medications   Medication Sig Start Date End Date Taking? Authorizing Provider  aspirin 325 MG tablet Take by mouth.    [provider]  hydrocortisone-pramoxine (ANALPRAM HC) 2.5-1 % rectal cream Place 1 application rectally 3 (three) times daily. 07/26/16   Janne Napoleon, NP  ibuprofen (ADVIL) 800 MG tablet Take 1 tablet (800 mg total) by mouth every 8 (eight) hours as needed (fever and/or body aches). 01/22/19   Corena Herter, PA-C  losartan-hydrochlorothiazide (HYZAAR) 50-12.5 MG tablet Take 1 tablet by mouth daily. 09/20/19   [provider]  naproxen (NAPROSYN) 375 MG tablet Take 1 tablet (375 mg total) by mouth 2 (two) times daily. 09/26/20   Raspet, Derry Skill, PA-C  Omega-3 Fatty Acids (FISH OIL) 1000 MG CAPS Take by mouth.    [provider]  OVER THE COUNTER MEDICATION Take 1 tablet by mouth daily. OTC Weight loss pill    [provider]  promethazine (PHENERGAN) 25 MG tablet Take 1 tablet (25 mg total) by mouth every 6 (six) hours as needed for nausea. 04/18/12   Leonard Schwartz, MD  traMADol (ULTRAM) 50 MG tablet Take 1 tablet (50 mg total) by mouth every 6 (six) hours as needed. 07/26/16   Janne Napoleon, NP      Allergies    Patient has no known allergies.    Review of  Systems   Review of Systems  All other systems reviewed and are negative.  Physical Exam Updated Vital Signs BP (!) 149/99 (BP Location: Right Arm)    Pulse 86    Temp 98.8 F (37.1 C)    Resp 20    SpO2 99%  Physical Exam Vitals and nursing note reviewed.  Constitutional:      General: He is not in acute distress.    Appearance: Normal appearance.  HENT:     Head: Normocephalic and atraumatic.  Eyes:     General:        Right eye: No discharge.        Left eye: No discharge.  Cardiovascular:     Comments: Regular rate and rhythm.  S1/S2 are distinct without any evidence of murmur, rubs, or gallops.  Radial pulses are 2+ bilaterally.  Dorsalis pedis pulses are 2+ bilaterally.  No evidence of pedal edema. Pulmonary:     Comments: Clear to auscultation bilaterally.  Normal effort.  No respiratory distress.  No evidence of wheezes, rales, or rhonchi heard throughout. Abdominal:     General: Abdomen is flat. Bowel sounds are normal. There is no distension.     Tenderness: There is no abdominal tenderness. There is no guarding or rebound.  Musculoskeletal:        General: Normal range of motion.  Cervical back: Neck supple.  Skin:    General: Skin is warm and dry.     Findings: No rash.  Neurological:     General: No focal deficit present.     Mental Status: He is alert.     Comments: Cranial nerves II through XII are intact.  Patient able to swallow water without difficulty.  5/5 strength to the upper and lower extremities.  Normal sensation to the upper and lower extremities.  Normal finger-nose.  No pronator drift.  Psychiatric:        Mood and Affect: Mood normal.        Behavior: Behavior normal.    ED Results / Procedures / Treatments   Labs (all labs ordered are listed, but only abnormal results are displayed) Labs Reviewed  CBC WITH DIFFERENTIAL/PLATELET - Abnormal; Notable for the following components:      Result Value   Platelets 897 (*)    All other  components within normal limits  BASIC METABOLIC PANEL    EKG None  Radiology DG Neck Soft Tissue  Result Date: 04/21/2021 CLINICAL DATA:  Dysphagia EXAM: NECK SOFT TISSUES - 1+ VIEW COMPARISON:  None. FINDINGS: There is no evidence of retropharyngeal soft tissue swelling or epiglottic enlargement. The cervical airway is unremarkable. There are a few punctate mildly radiopaque foci within the oropharynx. IMPRESSION: Few punctate mildly radiopaque foci within the oropharynx which could represent tiny residual particles of food/liquid. No other evidence of radiopaque foreign body. No prevertebral soft tissue swelling. Electronically Signed   By: Maurine Simmering M.D.   On: 04/21/2021 10:28   DG Chest Portable 1 View  Result Date: 04/21/2021 CLINICAL DATA:  Dysphagia.  None discomfort swallowing. EXAM: PORTABLE CHEST 1 VIEW COMPARISON:  View chest x-ray 01/22/2019 FINDINGS: Heart size is normal. Lung volumes are low. Elevation of the right hemidiaphragm has increased since the prior exam. Mild atelectasis is present at both lung bases. IMPRESSION: 1. Low lung volumes and bibasilar atelectasis. 2. Increasing elevation of the right hemidiaphragm. Electronically Signed   By: San Morelle M.D.   On: 04/21/2021 10:28    Procedures Procedures    Medications Ordered in ED Medications - No data to display  ED Course/ Medical Decision Making/ A&P                           Medical Decision Making Amount and/or Complexity of Data Reviewed Labs: ordered. Radiology: ordered.   Walter Richardson is a 41 y.o. male with significant morbidities to impact his care today to include thrombocytosis who presents to the emergency department for 3-day history of dysphagia.  Differential diagnosis includes transfer dysphagia versus transport dysphagia.  Patient having symptoms of both.  No respiratory distress at this time.  I was able to watch him swallow water without difficulty at bedside.  I doubt CVA at  this time.  He has a normal neurological exam.  He had normal MRI brain and orbits back in November per old records.  I will plan to get a neck and chest x-ray to evaluate for dysphagia.  We will also get CBC and BMP to trend thrombocytosis.  Per hematology notes he supposed to have labs done every 3 months.  He is due this month.  I personally reviewed the labs and imaging.  CBC did reveal thrombocytosis which seems to be a little worse than his baseline.  He does have an appointment with a hematologist on  the 20th of this month.  I will have him keep that appointment.  X-rays were negative but there was evidence of possible food particles.  I do agree with the radiologist interpretation.  Patient was able to tolerate p.o. intake in the department today.  I will set up an ambulatory referral for gastroenterology for possible endoscopy.  I will also give the number to call.  Overall vital signs are stable and he is in no acute distress.  I will write him off for the rest today for work.  He is safe for discharge.  Final Clinical Impression(s) / ED Diagnoses Final diagnoses:  Other dysphagia    Rx / DC Orders ED Discharge Orders          Ordered    Ambulatory referral to Gastroenterology        04/21/21 1136              Cherrie Gauze 04/21/21 1138    Lajean Saver, MD 04/21/21 1212

## 2021-04-21 NOTE — ED Notes (Signed)
I provided reinforced discharge education based off of discharge instructions. Pt acknowledged and understood my education. Pt had no further questions/concerns for provider/myself.  °

## 2021-04-21 NOTE — ED Triage Notes (Addendum)
Per pt, states food and liquids have been getting "hung up" in his throat for the past couple of days-some discomfort with swallowing-no new meds-patient maintaining airway-no s/s's of distress

## 2021-04-21 NOTE — Discharge Instructions (Signed)
Please follow-up with gastroenterology for possible endoscopy.  Please return to the emergency department for any worsening symptoms you might have.

## 2021-05-07 ENCOUNTER — Ambulatory Visit: Payer: 59 | Admitting: Gastroenterology

## 2021-05-15 ENCOUNTER — Other Ambulatory Visit: Payer: Self-pay

## 2021-05-15 DIAGNOSIS — L219 Seborrheic dermatitis, unspecified: Secondary | ICD-10-CM | POA: Insufficient documentation

## 2021-05-18 ENCOUNTER — Ambulatory Visit: Payer: 59 | Admitting: Nurse Practitioner

## 2021-05-18 ENCOUNTER — Encounter: Payer: Self-pay | Admitting: Nurse Practitioner

## 2021-05-18 NOTE — Progress Notes (Deleted)
? ? ? ?05/18/2021 ?[place] ?[id number] ?1980-03-27 ? ? ?CHIEF COMPLAINT:  ? ?HISTORY OF PRESENT ILLNESS: Calub Tarnow. Codispoti is a 41 year old male with a past medical history of ? ?CBC Latest Ref Rng & Units 04/21/2021 01/12/2021 01/12/2021  ?WBC 4.0 - 10.5 K/uL 6.1 8.7 -  ?Hemoglobin 13.0 - 17.0 g/dL 16.2 16.9 17.3(H)  ?Hematocrit 39.0 - 52.0 % 48.7 51.6 51.0  ?Platelets 150 - 400 K/uL 897(H) 893(H) -  ?  ?CMP Latest Ref Rng & Units 04/21/2021 01/12/2021 01/12/2021  ?Glucose 70 - 99 mg/dL 91 68(L) 70  ?BUN 6 - 20 mg/dL '12 17 19  '$ ?Creatinine 0.61 - 1.24 mg/dL 1.00 1.04 1.00  ?Sodium 135 - 145 mmol/L 138 137 139  ?Potassium 3.5 - 5.1 mmol/L 3.9 3.5 3.7  ?Chloride 98 - 111 mmol/L 104 101 105  ?CO2 22 - 32 mmol/L 27 27 -  ?Calcium 8.9 - 10.3 mg/dL 9.4 9.1 -  ?Total Protein 6.5 - 8.1 g/dL - 7.0 -  ?Total Bilirubin 0.3 - 1.2 mg/dL - 0.9 -  ?Alkaline Phos 38 - 126 U/L - 50 -  ?AST 15 - 41 U/L - 33 -  ?ALT 0 - 44 U/L - 62(H) -  ?  ? ?No past medical history on file. ?Past Surgical History:  ?Procedure Laterality Date  ? FRACTURE SURGERY    ? ?Social History: ? ?Family History:  ? ? reports that he has been smoking cigarettes. He has never used smokeless tobacco. He reports current alcohol use. He reports that he does not use drugs. ?Family history is unknown by patient. ? ?No Known Allergies ? ?  ?Outpatient Encounter Medications as of 05/18/2021  ?Medication Sig  ? aspirin 325 MG tablet Take 1 tablet by mouth daily.  ? hydrocortisone-pramoxine (ANALPRAM HC) 2.5-1 % rectal cream Place 1 application rectally 3 (three) times daily.  ? ibuprofen (ADVIL) 800 MG tablet Take 1 tablet (800 mg total) by mouth every 8 (eight) hours as needed (fever and/or body aches).  ? losartan (COZAAR) 100 MG tablet Take 1 tablet by mouth daily.  ? losartan-hydrochlorothiazide (HYZAAR) 50-12.5 MG tablet Take 1 tablet by mouth daily.  ? naproxen (NAPROSYN) 375 MG tablet Take 1 tablet (375 mg total) by mouth 2 (two) times daily.  ? Omega-3 Fatty Acids  (FISH OIL) 1000 MG CAPS Take by mouth.  ? OVER THE COUNTER MEDICATION Take 1 tablet by mouth daily. OTC Weight loss pill  ? promethazine (PHENERGAN) 25 MG tablet Take 1 tablet (25 mg total) by mouth every 6 (six) hours as needed for nausea.  ? traMADol (ULTRAM) 50 MG tablet Take 1 tablet (50 mg total) by mouth every 6 (six) hours as needed.  ? ?No facility-administered encounter medications on file as of 05/18/2021.  ? ? ? ?REVIEW OF SYSTEMS:  ?Gen: Denies fever, sweats or chills. No weight loss.  ?CV: Denies chest pain, palpitations or edema. ?Resp: Denies cough, shortness of breath of hemoptysis.  ?GI: Denies heartburn, dysphagia, stomach or lower abdominal pain. No diarrhea or constipation.  ?GU : Denies urinary burning, blood in urine, increased urinary frequency or incontinence. ?MS: Denies joint pain, muscles aches or weakness. ?Derm: Denies rash, itchiness, skin lesions or unhealing ulcers. ?Psych: Denies depression, anxiety, memory loss, suicidal ideation and confusion. ?Heme: Denies bruising, easy bleeding. ?Neuro:  Denies headaches, dizziness or paresthesias. ?Endo:  Denies any problems with DM, thyroid or adrenal function. ? ?PHYSICAL EXAM: ?There were no vitals taken for this visit. ?  General: Well developed ... in no acute distress. ?Head: Normocephalic and atraumatic. ?Eyes:  Sclerae non-icteric, conjunctive pink. ?Ears: Normal auditory acuity. ?Mouth: Dentition intact. No ulcers or lesions.  ?Neck: Supple, no lymphadenopathy or thyromegaly.  ?Lungs: Clear bilaterally to auscultation without wheezes, crackles or rhonchi. ?Heart: Regular rate and rhythm. No murmur, rub or gallop appreciated.  ?Abdomen: Soft, nontender, non distended. No masses. No hepatosplenomegaly. Normoactive bowel sounds x 4 quadrants.  ?Rectal:  ?Musculoskeletal: Symmetrical with no gross deformities. ?Skin: Warm and dry. No rash or lesions on visible extremities. ?Extremities: No edema. ?Neurological: Alert oriented x 4, no focal  deficits.  ?Psychological:  Alert and cooperative. Normal mood and affect. ? ?ASSESSMENT AND PLAN: ? ?Thrombocytosis ?-CBC ? ?Elevated Alt ?-Hepatic panel  ? ? ? ?CC:  Hendricks Limes, PA-C ? ? ? ?

## 2021-05-29 ENCOUNTER — Ambulatory Visit: Payer: 59 | Admitting: Nurse Practitioner

## 2021-06-24 ENCOUNTER — Encounter (HOSPITAL_COMMUNITY): Payer: Self-pay | Admitting: Emergency Medicine

## 2021-06-24 ENCOUNTER — Emergency Department (HOSPITAL_COMMUNITY)
Admission: EM | Admit: 2021-06-24 | Discharge: 2021-06-24 | Disposition: A | Discharge status: Home / Self Care | Payer: 59 | Attending: Emergency Medicine | Admitting: Emergency Medicine

## 2021-06-24 ENCOUNTER — Emergency Department (HOSPITAL_COMMUNITY): Payer: 59

## 2021-06-24 DIAGNOSIS — F17200 Nicotine dependence, unspecified, uncomplicated: Secondary | ICD-10-CM | POA: Diagnosis not present

## 2021-06-24 DIAGNOSIS — Z79899 Other long term (current) drug therapy: Secondary | ICD-10-CM | POA: Diagnosis not present

## 2021-06-24 DIAGNOSIS — G7000 Myasthenia gravis without (acute) exacerbation: Secondary | ICD-10-CM | POA: Diagnosis not present

## 2021-06-24 DIAGNOSIS — Z7982 Long term (current) use of aspirin: Secondary | ICD-10-CM | POA: Diagnosis not present

## 2021-06-24 DIAGNOSIS — R0602 Shortness of breath: Secondary | ICD-10-CM

## 2021-06-24 DIAGNOSIS — R062 Wheezing: Secondary | ICD-10-CM | POA: Diagnosis not present

## 2021-06-24 DIAGNOSIS — R0601 Orthopnea: Secondary | ICD-10-CM | POA: Diagnosis not present

## 2021-06-24 DIAGNOSIS — I10 Essential (primary) hypertension: Secondary | ICD-10-CM | POA: Insufficient documentation

## 2021-06-24 DIAGNOSIS — D72819 Decreased white blood cell count, unspecified: Secondary | ICD-10-CM | POA: Insufficient documentation

## 2021-06-24 LAB — CBC WITH DIFFERENTIAL/PLATELET
Abs Immature Granulocytes: 0.02 10*3/uL (ref 0.00–0.07)
Basophils Absolute: 0.1 10*3/uL (ref 0.0–0.1)
Basophils Relative: 1 %
Eosinophils Absolute: 0.1 10*3/uL (ref 0.0–0.5)
Eosinophils Relative: 1 %
HCT: 48.5 % (ref 39.0–52.0)
Hemoglobin: 16 g/dL (ref 13.0–17.0)
Immature Granulocytes: 1 %
Lymphocytes Relative: 26 %
Lymphs Abs: 1 10*3/uL (ref 0.7–4.0)
MCH: 29.7 pg (ref 26.0–34.0)
MCHC: 33 g/dL (ref 30.0–36.0)
MCV: 90 fL (ref 80.0–100.0)
Monocytes Absolute: 0.4 10*3/uL (ref 0.1–1.0)
Monocytes Relative: 11 %
Neutro Abs: 2.4 10*3/uL (ref 1.7–7.7)
Neutrophils Relative %: 60 %
Platelets: 458 10*3/uL — ABNORMAL HIGH (ref 150–400)
RBC: 5.39 MIL/uL (ref 4.22–5.81)
RDW: 13.3 % (ref 11.5–15.5)
WBC: 3.9 10*3/uL — ABNORMAL LOW (ref 4.0–10.5)
nRBC: 0 % (ref 0.0–0.2)

## 2021-06-24 LAB — COMPREHENSIVE METABOLIC PANEL
ALT: 41 U/L (ref 0–44)
AST: 43 U/L — ABNORMAL HIGH (ref 15–41)
Albumin: 3.8 g/dL (ref 3.5–5.0)
Alkaline Phosphatase: 67 U/L (ref 38–126)
Anion gap: 8 (ref 5–15)
BUN: 9 mg/dL (ref 6–20)
CO2: 25 mmol/L (ref 22–32)
Calcium: 9 mg/dL (ref 8.9–10.3)
Chloride: 105 mmol/L (ref 98–111)
Creatinine, Ser: 1.06 mg/dL (ref 0.61–1.24)
GFR, Estimated: >60 mL/min (ref >60)
Glucose, Bld: 90 mg/dL (ref 70–99)
Potassium: 3.8 mmol/L (ref 3.5–5.1)
Sodium: 138 mmol/L (ref 135–145)
Total Bilirubin: 0.5 mg/dL (ref 0.3–1.2)
Total Protein: 8 g/dL (ref 6.5–8.1)

## 2021-06-24 LAB — D-DIMER, QUANTITATIVE: D-Dimer, Quant: 0.72 ug/mL-FEU — ABNORMAL HIGH (ref 0.00–0.50)

## 2021-06-24 LAB — BRAIN NATRIURETIC PEPTIDE: B Natriuretic Peptide: 14.6 pg/mL (ref 0.0–100.0)

## 2021-06-24 IMAGING — CR DG CHEST 2V
2 series · 2 of 2 positions shown · non-contrast
Comparison: [DATE]

CLINICAL DATA: Pt c/o SOB for the past month, but worsening over
the past few days. Reports a cough, but denies any nasal congestion
or rhinorrhea. Denies any chest pain. H/o myasthenia gravis.

EXAM:
CHEST - 2 VIEW

[w chest pa]
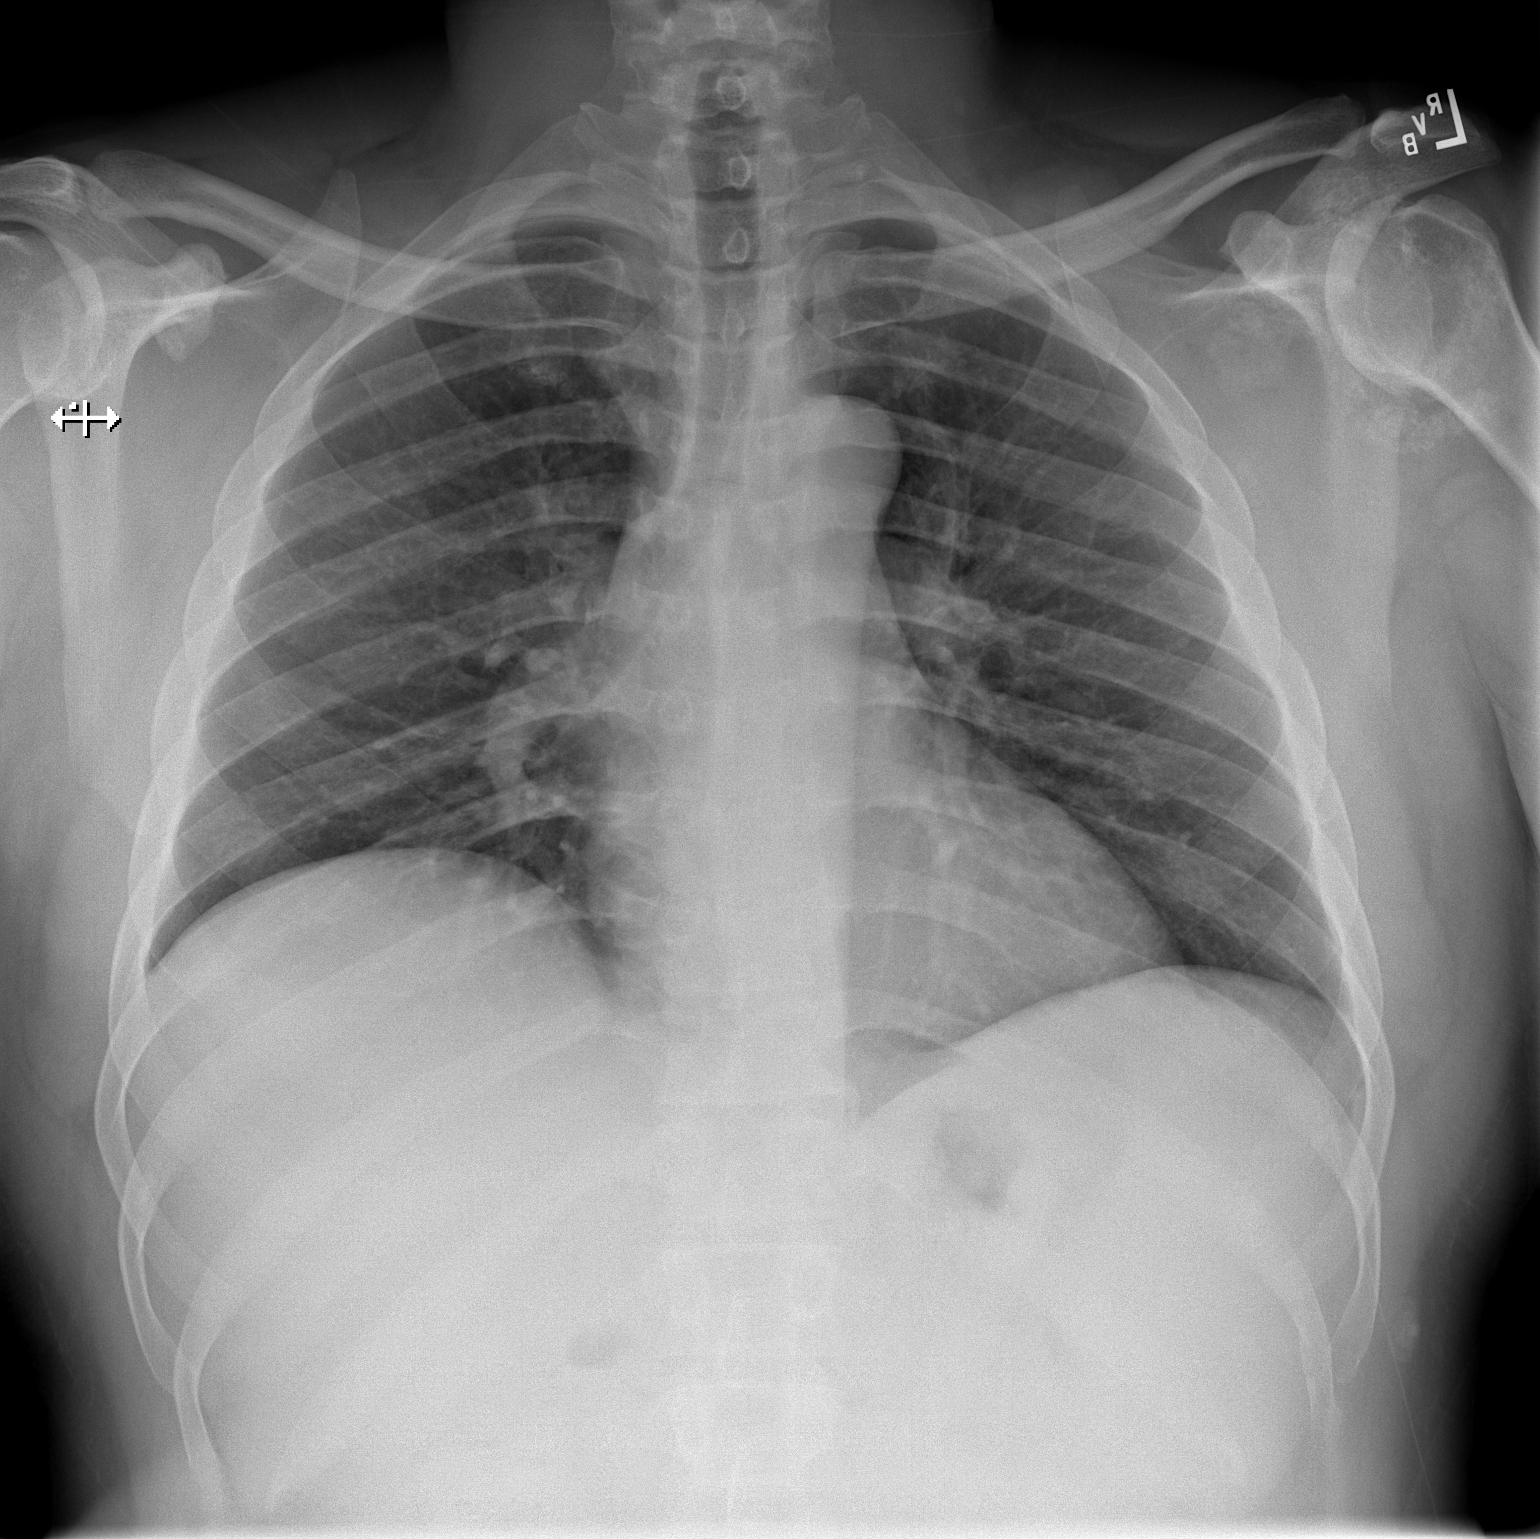

[w chest lat]
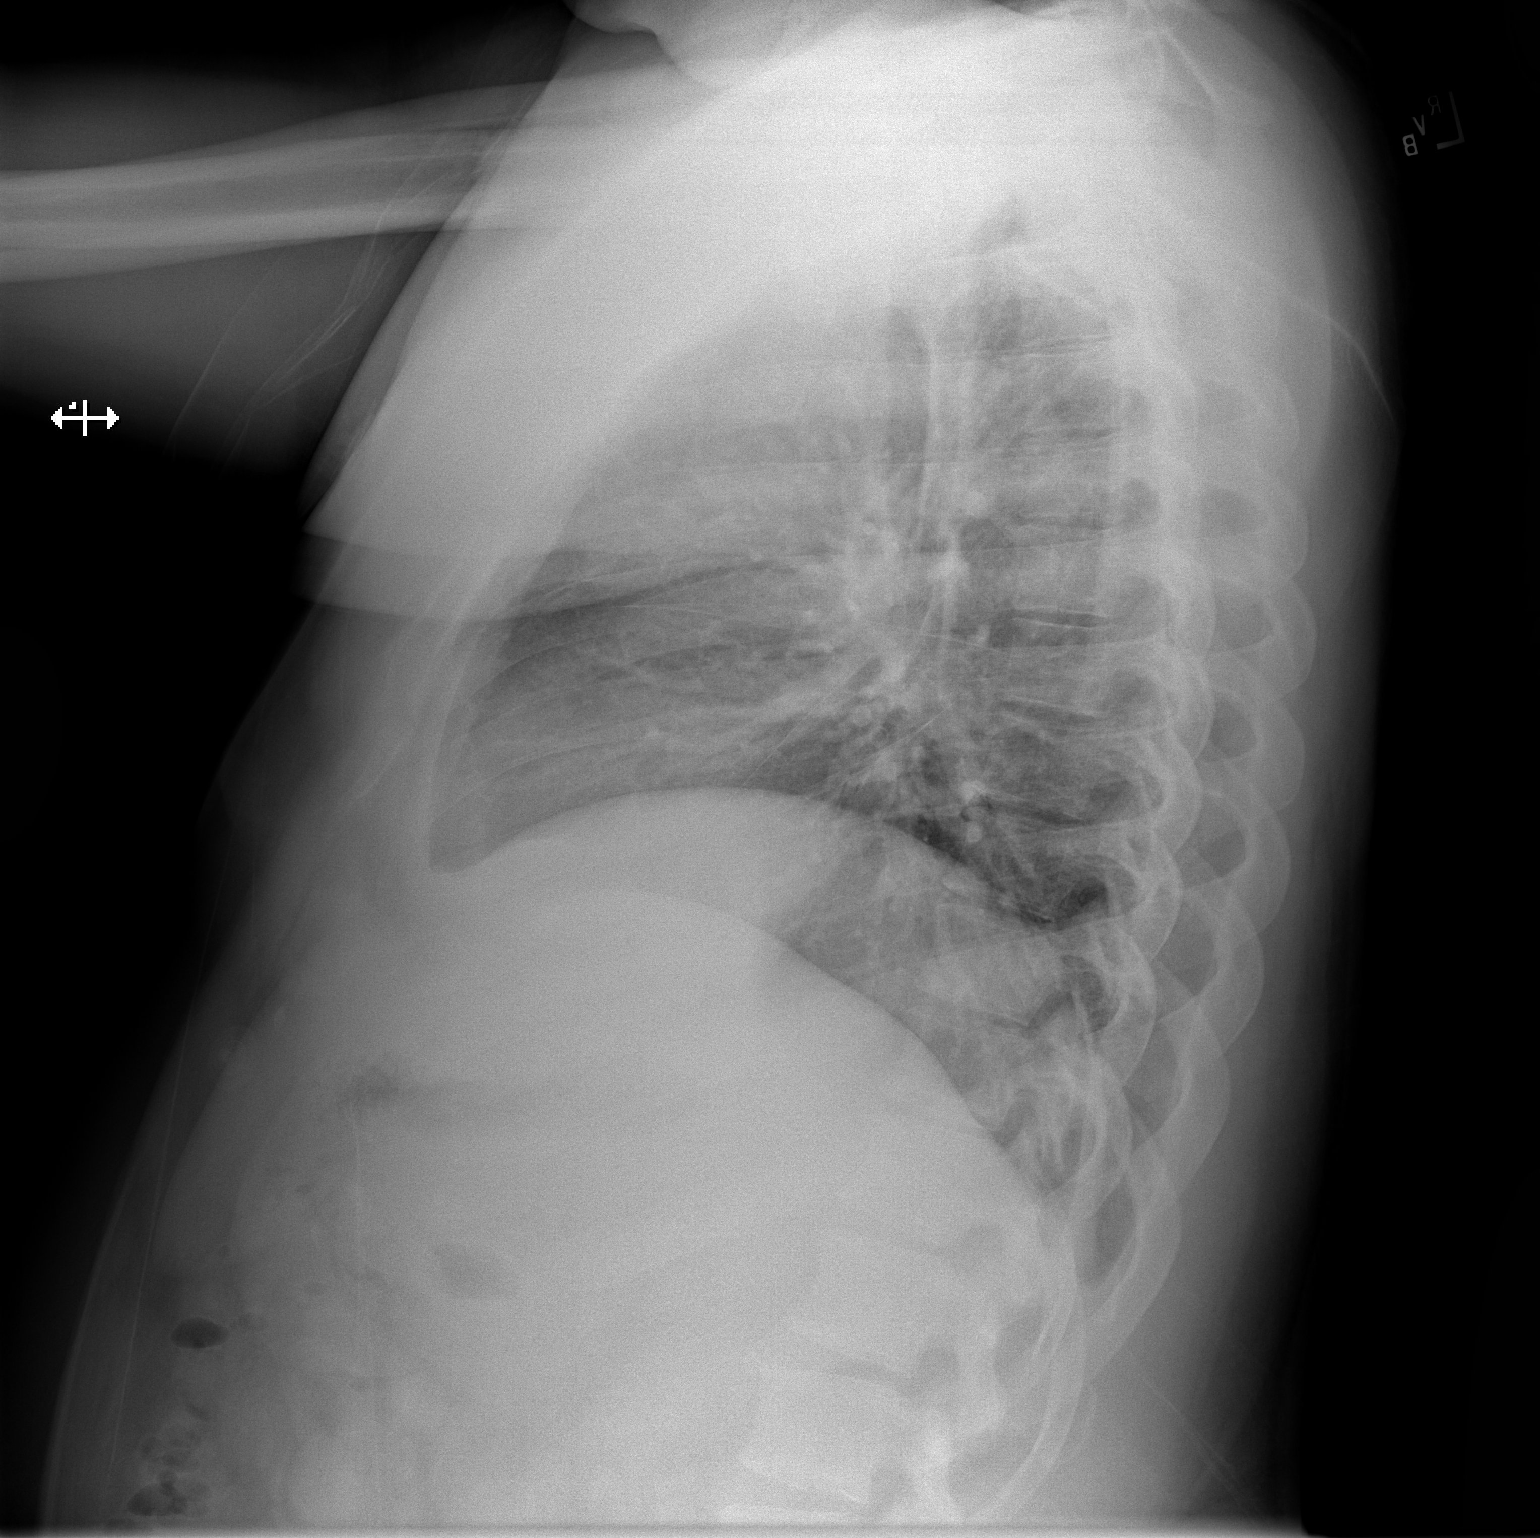

[2 of 2 positions shown; findings below may reference images not displayed]

FINDINGS: Lungs are clear.

Heart size and mediastinal contours are within normal limits.

No effusion.

Left shoulder DJD.
IMPRESSION: No acute cardiopulmonary disease.

## 2021-06-24 IMAGING — CT CT ANGIO CHEST
2 of 6 series · 17 of 36 positions shown · IV contrast (OMNIPAQUE 350)
Comparison: Left shoulder radiographs [DATE]

CLINICAL DATA: Shortness of breath. Evaluate for pulmonary
embolism.

EXAM:
CT ANGIOGRAPHY CHEST WITH CONTRAST
TECHNIQUE: Multidetector CT imaging of the chest was performed using the
standard protocol during bolus administration of intravenous
contrast. Multiplanar CT image reconstructions and MIPs were
obtained to evaluate the vascular anatomy.

[Series 5: thins · axial · 0.71mm/px · z∈[+1270,+1498]mm · 16 of 258 slices shown]
[im 15/258  lung]
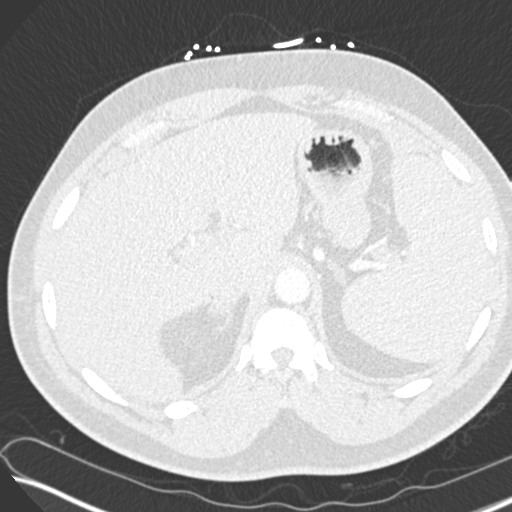
[im 29/258  mediastinal]
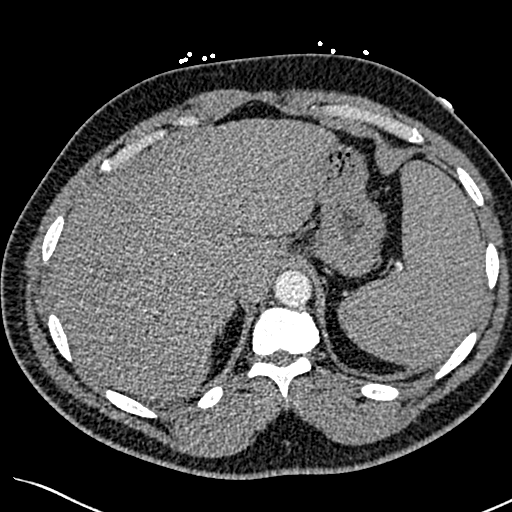
[im 43/258  lung]
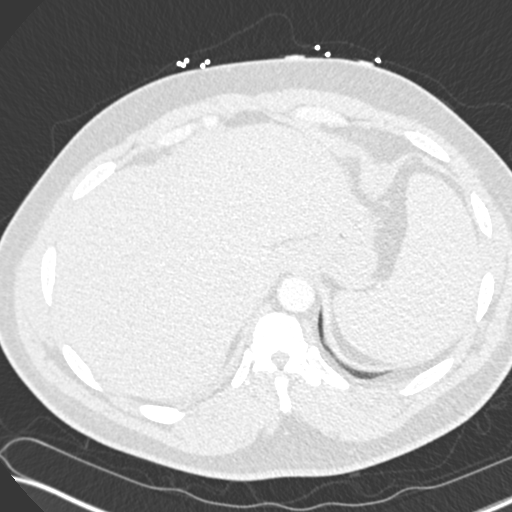
[im 58/258  mediastinal]
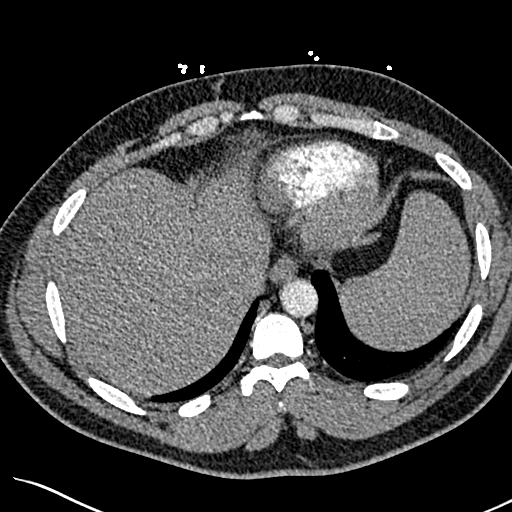
[im 72/258  lung]
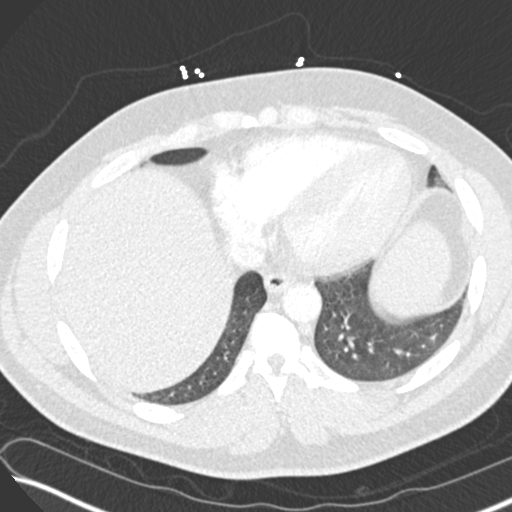
[im 86/258  mediastinal]
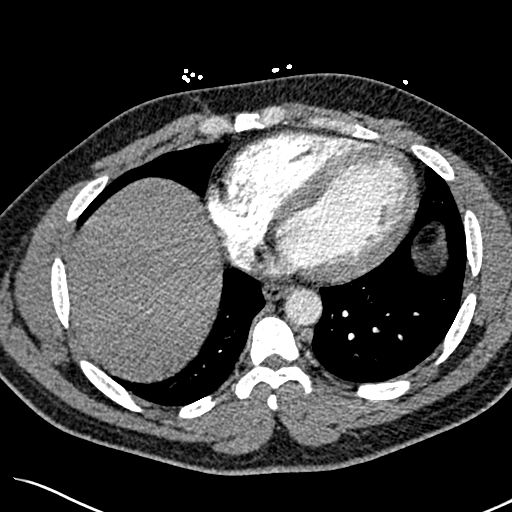
[im 100/258  lung]
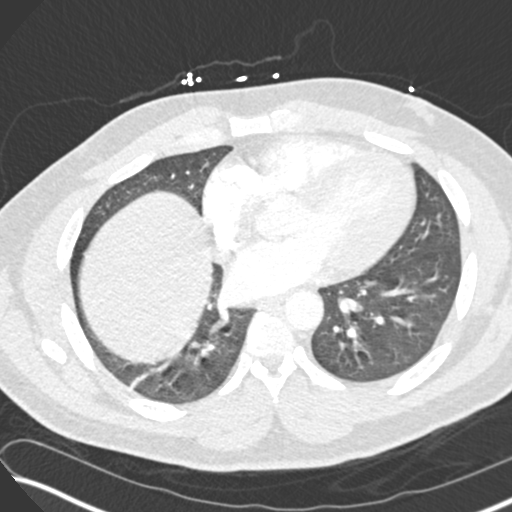
[im 115/258  mediastinal]
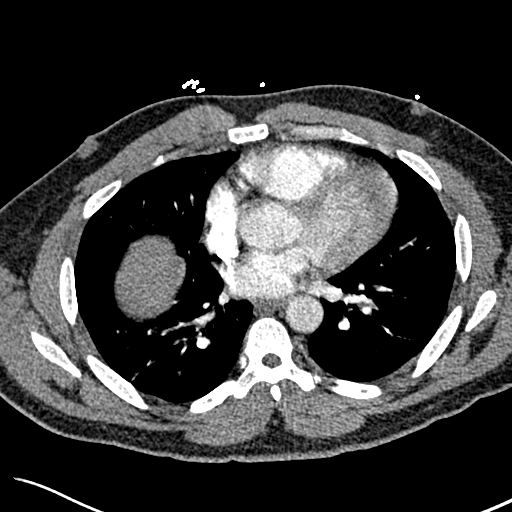
[im 143/258  lung]
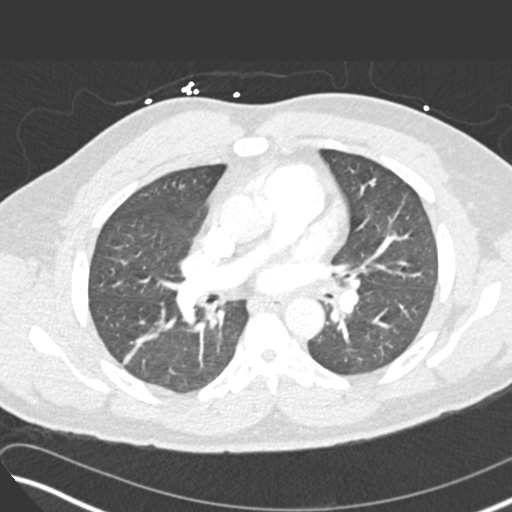
[im 158/258  mediastinal]
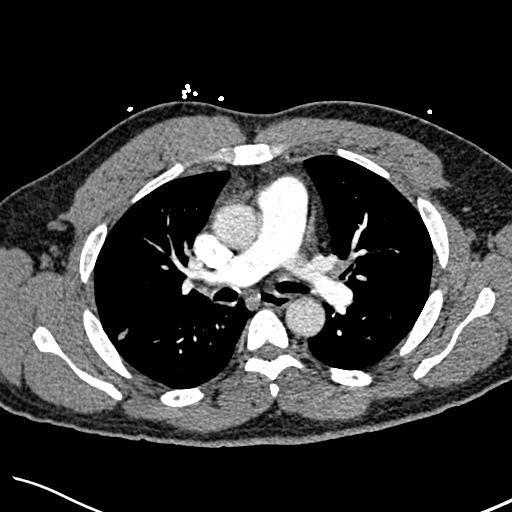
[im 172/258  lung]
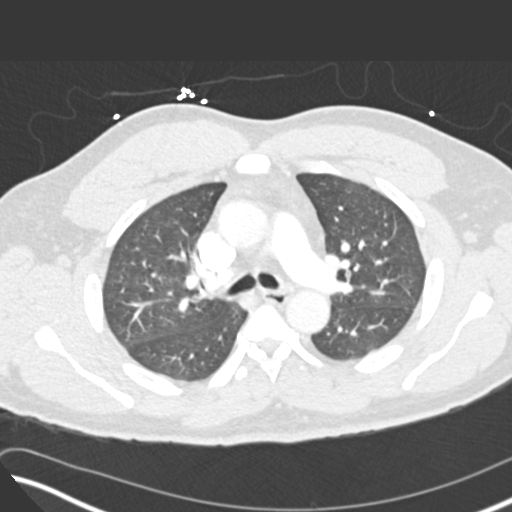
[im 186/258  mediastinal]
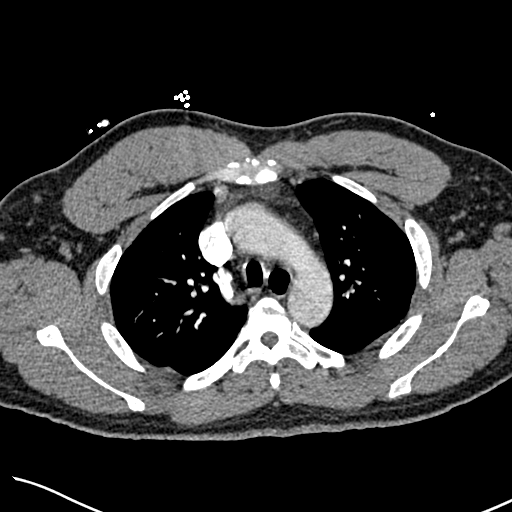
[im 200/258  lung]
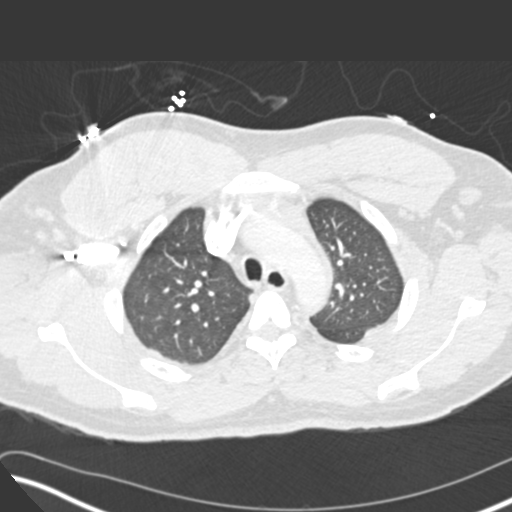
[im 215/258  mediastinal]
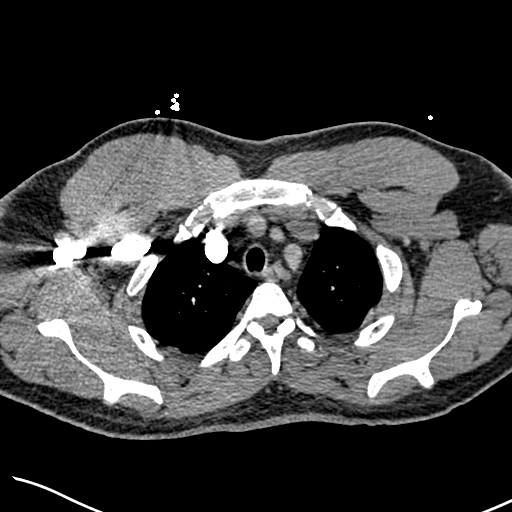
[im 229/258  lung]
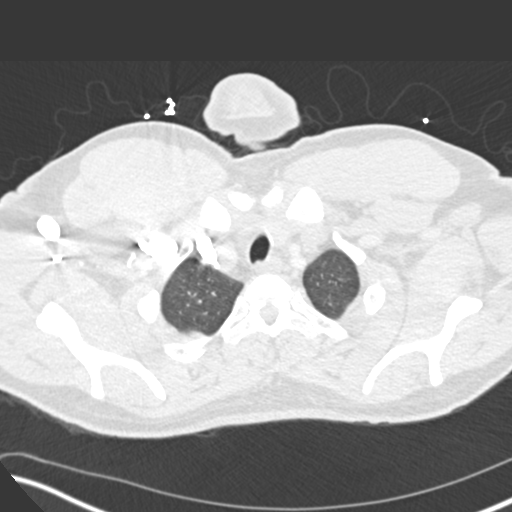
[im 243/258  mediastinal]
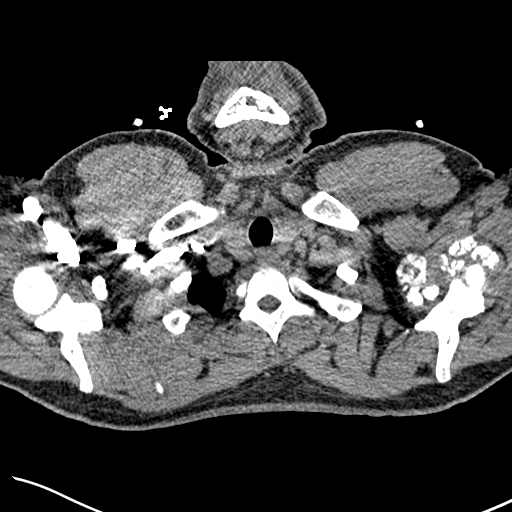

[Series 7: coronal mpr · coronal · 0.54mm/px · 1 of 151 slices shown]
[im 76/151  mediastinal]
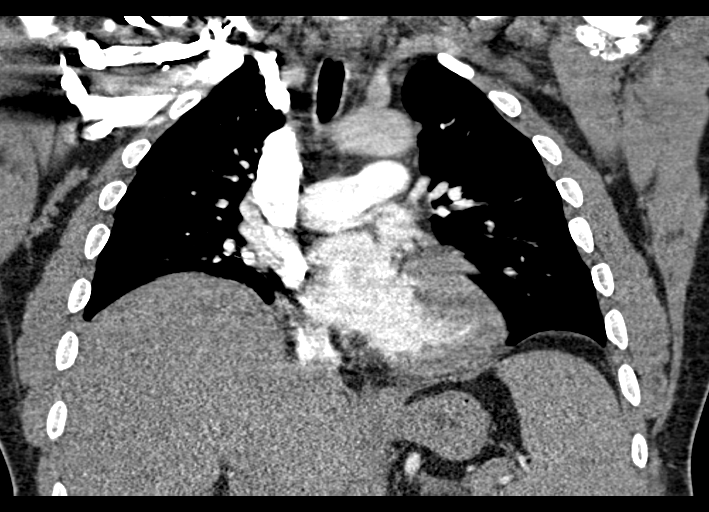

[17 of 36 positions shown; findings below may reference images not displayed]

RADIATION DOSE REDUCTION: This exam was performed according to the
departmental dose-optimization program which includes automated
exposure control, adjustment of the mA and/or kV according to
patient size and/or use of iterative reconstruction technique.

CONTRAST:  100mL OMNIPAQUE IOHEXOL 350 MG/ML SOLN
FINDINGS: Vascular Findings:

There is adequate opacification of the pulmonary arterial system
with the main pulmonary artery measuring 252 Hounsfield units. There
are no discrete filling defects within the pulmonary arterial tree
to the level of the bilateral subsegmental pulmonary arteries to
suggest pulmonary embolism. Evaluation of the distal subsegmental
pulmonary arteries is degraded secondary to patient respiratory
artifact. Normal caliber of the main pulmonary artery.

Cardiomegaly.  No pericardial effusion.

No evidence of thoracic aortic aneurysm or dissection on this non
gated examination. Conventional configuration of the aortic arch.
The branch vessels of the aortic arch appear widely patent
throughout their imaged courses. Incidental note is made of a aortic
nipple.

Review of the MIP images confirms the above findings.

----------------------------------------------------------------------------------

Nonvascular Findings:

Mediastinum/Lymph Nodes: No bulky mediastinal, hilar or axillary
lymph adenopathy. Ill-defined soft tissue in the anterior
mediastinum is without associated mass effect may represent residual
thymic tissue.

Lungs/Pleura: Minimal right lower lung linear subsegmental
atelectasis. No discrete focal airspace opacities. No air
bronchograms. No pleural effusion or pneumothorax. The central
pulmonary airways appear patent.

There is a punctate (approximately 2 mm) nodule within the superior
segment of the left lower lobe (image 45, series 6).

Upper abdomen: Limited early arterial phase evaluation of the upper
abdomen demonstrates diffuse decreased attenuation of the hepatic
parenchyma suggestive of hepatic steatosis. The spleen is enlarged
measuring 14.6 cm in length (image 125, series 4).

Musculoskeletal: No acute or aggressive osseous abnormalities.
Stigmata of DISH within the caudal aspect of the thoracic spine.
Moderate to severe degenerative change of the left glenohumeral
joint with multiple scattered adjacent intra-articular loose bodies
without discrete donor site is identified. Mild degenerative change
of the contralateral right glenohumeral joint, incompletely
evaluated. Bilateral gynecomastia. Normal appearance of the thyroid
gland.
IMPRESSION: 1. No acute cardiopulmonary disease. Specifically, no evidence of
pulmonary embolism to the level of the subsegmental pulmonary
arteries.
2. Cardiomegaly.
3. Suspected hepatic steatosis with splenomegaly. Correlation with
LFTs is advised.
4. Punctate (2 mm) left lower lobe pulmonary nodule. No follow-up
needed if patient is low-risk.This recommendation follows the
consensus statement: Guidelines for Management of Incidental
Pulmonary Nodules Detected on CT Images: From the [HOSPITAL]
5. Severe degenerative change of the left glenohumeral joint with
multiple scattered intra-articular loose bodies, incompletely
evaluated though without discrete donor site identified and new
compared to remote left shoulder radiographs performed [DATE].

## 2021-06-24 MED ORDER — IOHEXOL 350 MG/ML SOLN
100.0000 mL | Freq: Once | INTRAVENOUS | Status: AC | PRN
  Administered 2021-06-24: 100 mL via INTRAVENOUS

## 2021-06-24 MED ORDER — ALBUTEROL SULFATE HFA 108 (90 BASE) MCG/ACT IN AERS
4.0000 | INHALATION_SPRAY | Freq: Once | RESPIRATORY_TRACT | Status: AC
  Administered 2021-06-24: 4 via RESPIRATORY_TRACT
  Filled 2021-06-24: qty 6.7

## 2021-06-24 NOTE — Discharge Instructions (Signed)
Keep your scheduled outpatient appointments for ongoing care of your myasthenia gravis.  There is a number below to call to establish care with cardiology to [facility] health.  Utilize albuterol inhaler as needed.  Return to the emergency department for worsening severity of symptoms. ?

## 2021-06-24 NOTE — ED Notes (Signed)
Pt given water per request and w/ EDP permission.  

## 2021-06-24 NOTE — ED Provider Notes (Signed)
?Walter Richardson ?Provider Note ? ? ?CSN: [id number] ?Arrival date & time: 06/24/21  0850 ? ?  ? ?History ? ?Chief Complaint  ?Patient presents with  ? Shortness of Breath  ? ? ?Walter Richardson is a 41 y.o. male. ? ? ?Shortness of Breath ?Associated symptoms: cough   ?Patient presenting for shortness of breath.  He had a recent diagnosis of myasthenia gravis.  He has been undergoing IVIG therapy.  He has had improvement in speech and swallowing function.  He has not transient hematuria.  He was in contact with his neurology providers at [facility].  Given his IVIG therapy and history of thrombocytosis, they did recommend evaluation for possible PE. ? ?Patient reports that he has had ongoing shortness of breath for several months but this seems to have worsened over the past week.  He has had orthopnea.  He currently denies any shortness of breath at rest.  He denies any history of asthma or COPD.  He does have a smoking history.  He has had a recent cough.  He states that prior symptoms of diplopia and speech difficulty have all improved.  His last IVIG session was 1 week ago.  He continues to take Mestinon.  He takes ASA daily for his elevated platelet.  He does not take an oral steroid. ?  ? ?Home Medications ?Prior to Admission medications   ?Medication Sig Start Date End Date Taking? Authorizing Provider  ?aspirin 325 MG tablet Take 1 tablet by mouth daily.    [provider]  ?hydrocortisone-pramoxine (ANALPRAM HC) 2.5-1 % rectal cream Place 1 application rectally 3 (three) times daily. 07/26/16   Janne Napoleon, NP  ?ibuprofen (ADVIL) 800 MG tablet Take 1 tablet (800 mg total) by mouth every 8 (eight) hours as needed (fever and/or body aches). 01/22/19   Corena Herter, PA-C  ?losartan (COZAAR) 100 MG tablet Take 1 tablet by mouth daily. 05/08/21   [provider]  ?losartan-hydrochlorothiazide (HYZAAR) 50-12.5 MG tablet Take 1 tablet by mouth daily. 09/20/19    [provider]  ?naproxen (NAPROSYN) 375 MG tablet Take 1 tablet (375 mg total) by mouth 2 (two) times daily. 09/26/20   Raspet, Derry Skill, PA-C  ?Omega-3 Fatty Acids (FISH OIL) 1000 MG CAPS Take by mouth.    [provider]  ?OVER THE COUNTER MEDICATION Take 1 tablet by mouth daily. OTC Weight loss pill    [provider]  ?promethazine (PHENERGAN) 25 MG tablet Take 1 tablet (25 mg total) by mouth every 6 (six) hours as needed for nausea. 04/18/12   Leonard Schwartz, MD  ?traMADol (ULTRAM) 50 MG tablet Take 1 tablet (50 mg total) by mouth every 6 (six) hours as needed. 07/26/16   Janne Napoleon, NP  ?   ? ?Allergies    ?Patient has no known allergies.   ? ?Review of Systems   ?Review of Systems  ?Respiratory:  Positive for cough and shortness of breath.   ?All other systems reviewed and are negative. ? ?Physical Exam ?Updated Vital Signs ?BP (!) 170/116   Pulse 82   Temp 98.9 ?F (37.2 ?C) (Oral)   Resp (!) 27   Ht '5\' 10"'$  (1.778 m)   Wt 107 kg   SpO2 99%   BMI 33.86 kg/m?  ?Physical Exam ?Vitals and nursing note reviewed.  ?Constitutional:   ?   General: He is not in acute distress. ?   Appearance: He is well-developed. He is not ill-appearing,  toxic-appearing or diaphoretic.  ?HENT:  ?   Head: Normocephalic and atraumatic.  ?   Mouth/Throat:  ?   Mouth: Mucous membranes are moist.  ?   Pharynx: Oropharynx is clear.  ?Eyes:  ?   Extraocular Movements: Extraocular movements intact.  ?   Conjunctiva/sclera: Conjunctivae normal.  ?Cardiovascular:  ?   Rate and Rhythm: Normal rate and regular rhythm.  ?   Heart sounds: No murmur heard. ?Pulmonary:  ?   Effort: Pulmonary effort is normal. No tachypnea, accessory muscle usage or respiratory distress.  ?   Breath sounds: Wheezing present. No rales.  ?Chest:  ?   Chest wall: No mass or tenderness.  ?Abdominal:  ?   Palpations: Abdomen is soft.  ?   Tenderness: There is no abdominal tenderness.  ?Musculoskeletal:     ?   General: No swelling.  ?    Cervical back: Normal range of motion and neck supple.  ?   Right lower leg: No edema.  ?   Left lower leg: No edema.  ?Skin: ?   General: Skin is warm and dry.  ?   Capillary Refill: Capillary refill takes less than 2 seconds.  ?Neurological:  ?   General: No focal deficit present.  ?   Mental Status: He is alert and oriented to person, place, and time.  ?   Cranial Nerves: No cranial nerve deficit.  ?   Motor: No weakness.  ?Psychiatric:     ?   Mood and Affect: Mood normal.     ?   Behavior: Behavior normal.  ? ? ?ED Results / Procedures / Treatments   ?Labs ?(all labs ordered are listed, but only abnormal results are displayed) ?Labs Reviewed  ?COMPREHENSIVE METABOLIC PANEL - Abnormal; Notable for the following components:  ?    Result Value  ? AST 43 (*)   ? All other components within normal limits  ?CBC WITH DIFFERENTIAL/PLATELET - Abnormal; Notable for the following components:  ? WBC 3.9 (*)   ? Platelets 458 (*)   ? All other components within normal limits  ?D-DIMER, QUANTITATIVE - Abnormal; Notable for the following components:  ? D-Dimer, Quant 0.72 (*)   ? All other components within normal limits  ?BRAIN NATRIURETIC PEPTIDE  ? ? ?EKG ?EKG Interpretation ? ?Date/Time:  Wednesday June 24 2021 09:00:36 EDT ?Ventricular Rate:  88 ?PR Interval:  164 ?QRS Duration: 87 ?QT Interval:  342 ?QTC Calculation: 414 ?R Axis:   -26 ?Text Interpretation: Sinus rhythm Borderline left axis deviation Probable anterolateral infarct, old Borderline repolarization abnormality Baseline wander in lead(s) V6 Confirmed by Godfrey Pick [id number]) on 06/24/2021 11:13:45 AM ? ?Radiology ?DG Chest 2 View ? ?Result Date: 06/24/2021 ?CLINICAL DATA:  Pt c/o SOB for the past month, but worsening over the past few days. Reports a cough, but denies any nasal congestion or rhinorrhea. Denies any chest pain. H/o myasthenia gravis. EXAM: CHEST - 2 VIEW COMPARISON:  04/21/2021 FINDINGS: Lungs are clear. Heart size and mediastinal contours are  within normal limits. No effusion. Left shoulder DJD. IMPRESSION: No acute cardiopulmonary disease. Electronically Signed   By: Lucrezia Europe M.D.   On: 06/24/2021 10:04  ? ?CT Angio Chest PE W and/or Wo Contrast ? ?Result Date: 06/24/2021 ?CLINICAL DATA:  Shortness of breath. Evaluate for pulmonary embolism. EXAM: CT ANGIOGRAPHY CHEST WITH CONTRAST TECHNIQUE: Multidetector CT imaging of the chest was performed using the standard protocol during bolus administration of intravenous contrast. Multiplanar CT image reconstructions and  MIPs were obtained to evaluate the vascular anatomy. RADIATION DOSE REDUCTION: This exam was performed according to the departmental dose-optimization program which includes automated exposure control, adjustment of the mA and/or kV according to patient size and/or use of iterative reconstruction technique. CONTRAST:  123m OMNIPAQUE IOHEXOL 350 MG/ML SOLN COMPARISON:  Left shoulder radiographs 09/29/2006 FINDINGS: Vascular Findings: There is adequate opacification of the pulmonary arterial system with the main pulmonary artery measuring 252 Hounsfield units. There are no discrete filling defects within the pulmonary arterial tree to the level of the bilateral subsegmental pulmonary arteries to suggest pulmonary embolism. Evaluation of the distal subsegmental pulmonary arteries is degraded secondary to patient respiratory artifact. Normal caliber of the main pulmonary artery. Cardiomegaly.  No pericardial effusion. No evidence of thoracic aortic aneurysm or dissection on this non gated examination. Conventional configuration of the aortic arch. The branch vessels of the aortic arch appear widely patent throughout their imaged courses. Incidental note is made of a aortic nipple. Review of the MIP images confirms the above findings. ---------------------------------------------------------------------------------- Nonvascular Findings: Mediastinum/Lymph Nodes: No bulky mediastinal, hilar or  axillary lymph adenopathy. Ill-defined soft tissue in the anterior mediastinum is without associated mass effect may represent residual thymic tissue. Lungs/Pleura: Minimal right lower lung linear subsegmental atelectasis. No

## 2021-06-24 NOTE — ED Provider Triage Note (Signed)
Emergency Medicine Provider Triage Evaluation Note ? ?Maudie Mercury , a 41 y.o. male  was evaluated in triage.  Pt complains of SOB for the past month, but worsening over the past few days. Reports a cough, but denies any nasal congestion or rhinorrhea. Denies any chest pain. H/o myasthenia gravis.  ? ?Review of Systems  ?Positive:  ?Negative:  ? ?Physical Exam  ?BP (!) 163/122   Pulse 94   Temp 98.9 ?F (37.2 ?C) (Oral)   Resp 16   Ht '5\' 10"'$  (1.778 m)   Wt 107 kg   SpO2 100%   BMI 33.86 kg/m?  ?Gen:   Awake, no distress   ?Resp:  Normal effort  ?MSK:   Moves extremities without difficulty  ?Other:  Coarse wheezing, possible rhonchi heard in bilateral bases. Satting well on room air. Speaking in full sentences. No leg swelling. ? ?Medical Decision Making  ?Medically screening exam initiated at 9:07 AM.  Appropriate orders placed.  CYNTHIA STAINBACK Swatzell was informed that the remainder of the evaluation will be completed by another provider, this initial triage assessment does not replace that evaluation, and the importance of remaining in the ED until their evaluation is complete. ? ?Will order CXR, basic labs, and BNP. ?  ?Sherrell Puller, PA-C ?06/24/21 6770 ? ?

## 2021-06-24 NOTE — ED Notes (Signed)
Patient transported to CT 

## 2021-06-24 NOTE — ED Triage Notes (Signed)
Per pt, states he woke up early this am SOB-states he has a muscular condition that causes him to be SOB-states he thinks he needs a breathing treatment ?

## 2021-06-24 NOTE — ED Notes (Signed)
Pt noted to ambulate from triage to room w/o difficulty or labored breathing.  ?

## 2021-07-09 ENCOUNTER — Ambulatory Visit: Payer: 59 | Admitting: Interventional Cardiology

## 2022-05-24 DIAGNOSIS — Z7961 Long term (current) use of immunomodulator: Secondary | ICD-10-CM | POA: Diagnosis not present

## 2022-05-24 DIAGNOSIS — G7000 Myasthenia gravis without (acute) exacerbation: Secondary | ICD-10-CM | POA: Diagnosis not present

## 2022-06-02 DIAGNOSIS — I10 Essential (primary) hypertension: Secondary | ICD-10-CM | POA: Diagnosis not present

## 2022-06-02 DIAGNOSIS — Z13220 Encounter for screening for lipoid disorders: Secondary | ICD-10-CM | POA: Diagnosis not present

## 2022-06-02 DIAGNOSIS — D473 Essential (hemorrhagic) thrombocythemia: Secondary | ICD-10-CM | POA: Diagnosis not present

## 2022-06-02 DIAGNOSIS — G7000 Myasthenia gravis without (acute) exacerbation: Secondary | ICD-10-CM | POA: Diagnosis not present

## 2022-06-02 DIAGNOSIS — F17210 Nicotine dependence, cigarettes, uncomplicated: Secondary | ICD-10-CM | POA: Diagnosis not present

## 2022-06-02 DIAGNOSIS — Z0000 Encounter for general adult medical examination without abnormal findings: Secondary | ICD-10-CM | POA: Diagnosis not present

## 2022-07-05 DIAGNOSIS — D4989 Neoplasm of unspecified behavior of other specified sites: Secondary | ICD-10-CM | POA: Diagnosis not present

## 2022-07-16 DIAGNOSIS — Z9089 Acquired absence of other organs: Secondary | ICD-10-CM | POA: Diagnosis not present

## 2022-08-11 DIAGNOSIS — D473 Essential (hemorrhagic) thrombocythemia: Secondary | ICD-10-CM | POA: Diagnosis not present

## 2022-08-11 DIAGNOSIS — I10 Essential (primary) hypertension: Secondary | ICD-10-CM | POA: Diagnosis not present

## 2022-08-11 DIAGNOSIS — G7000 Myasthenia gravis without (acute) exacerbation: Secondary | ICD-10-CM | POA: Diagnosis not present

## 2024-03-12 ENCOUNTER — Other Ambulatory Visit (HOSPITAL_BASED_OUTPATIENT_CLINIC_OR_DEPARTMENT_OTHER): Payer: Self-pay

## 2024-03-12 ENCOUNTER — Other Ambulatory Visit: Payer: Self-pay

## 2024-03-12 ENCOUNTER — Encounter (HOSPITAL_BASED_OUTPATIENT_CLINIC_OR_DEPARTMENT_OTHER): Payer: Self-pay | Admitting: Emergency Medicine

## 2024-03-12 ENCOUNTER — Emergency Department (HOSPITAL_BASED_OUTPATIENT_CLINIC_OR_DEPARTMENT_OTHER): Admitting: Radiology

## 2024-03-12 ENCOUNTER — Emergency Department (HOSPITAL_BASED_OUTPATIENT_CLINIC_OR_DEPARTMENT_OTHER)
Admission: EM | Admit: 2024-03-12 | Discharge: 2024-03-12 | Disposition: A | Discharge status: Home / Self Care | Attending: Emergency Medicine | Admitting: Emergency Medicine

## 2024-03-12 DIAGNOSIS — R000 Tachycardia, unspecified: Secondary | ICD-10-CM | POA: Insufficient documentation

## 2024-03-12 DIAGNOSIS — Z79899 Other long term (current) drug therapy: Secondary | ICD-10-CM | POA: Diagnosis not present

## 2024-03-12 DIAGNOSIS — R0602 Shortness of breath: Secondary | ICD-10-CM | POA: Insufficient documentation

## 2024-03-12 DIAGNOSIS — R059 Cough, unspecified: Secondary | ICD-10-CM | POA: Diagnosis present

## 2024-03-12 DIAGNOSIS — Z7982 Long term (current) use of aspirin: Secondary | ICD-10-CM | POA: Insufficient documentation

## 2024-03-12 DIAGNOSIS — J111 Influenza due to unidentified influenza virus with other respiratory manifestations: Secondary | ICD-10-CM

## 2024-03-12 MED ORDER — ALBUTEROL SULFATE HFA 108 (90 BASE) MCG/ACT IN AERS
1.0000 | INHALATION_SPRAY | Freq: Four times a day (QID) | RESPIRATORY_TRACT | 0 refills | Status: AC | PRN
  Filled 2024-03-12 (×2): qty 6.7, 25d supply, fill #0

## 2024-03-12 MED ORDER — OSELTAMIVIR PHOSPHATE 75 MG PO CAPS
75.0000 mg | ORAL_CAPSULE | Freq: Two times a day (BID) | ORAL | 0 refills | Status: AC
  Filled 2024-03-12: qty 10, 5d supply, fill #0

## 2024-03-12 MED ORDER — KETOROLAC TROMETHAMINE 15 MG/ML IJ SOLN
15.0000 mg | Freq: Once | INTRAMUSCULAR | Status: AC
  Administered 2024-03-12: 15 mg via INTRAMUSCULAR
  Filled 2024-03-12: qty 1

## 2024-03-12 MED ORDER — BENZONATATE 100 MG PO CAPS
100.0000 mg | ORAL_CAPSULE | Freq: Three times a day (TID) | ORAL | 0 refills | Status: AC
  Filled 2024-03-12: qty 15, 5d supply, fill #0

## 2024-03-12 MED ORDER — ACETAMINOPHEN 325 MG PO TABS
650.0000 mg | ORAL_TABLET | Freq: Once | ORAL | Status: AC
  Administered 2024-03-12: 650 mg via ORAL
  Filled 2024-03-12: qty 2

## 2024-03-12 NOTE — ED Notes (Signed)

## 2024-03-12 NOTE — Discharge Instructions (Signed)
 Please read and follow all provided instructions.  Your diagnoses today include:  1. Influenza-like illness   2. Shortness of breath    Tests performed today include: Chest x-ray: Showed a hint of some fluid in the base of the right lung but no pneumonia Vital signs. See below for your results today.   Medications prescribed:  Tamiflu  - medication for influenza  This medication, when taken within the first 48 hours of illness, may help decrease the severity of the flu and cause symptoms to improve approximately 12 hours sooner. About 1 out of 5 patients may have diarrhea and vomiting from this medication.   Tessalon  Perles - cough suppressant medication  Take any prescribed medications only as directed.  Home care instructions:  Follow any educational materials contained in this packet. Please continue drinking plenty of fluids. Use over-the-counter cold and flu medications as needed as directed on packaging for symptom relief. You may also use ibuprofen  or tylenol  as directed on packaging for pain or fever.   BE VERY CAREFUL not to take multiple medicines containing Tylenol  (also called acetaminophen ). Doing so can lead to an overdose which can damage your liver and cause liver failure and possibly death.   Follow-up instructions: Please follow-up with your primary care provider in the next 3 days for further evaluation of your symptoms if not improving.   Return instructions:  Please return to the Emergency Department if you experience worsening symptoms. Please return if you have a high fever greater than 101 degrees not controlled with over-the-counter medications, persistent vomiting and cannot keep down fluids, or worsening trouble breathing. Please return if you have any other emergent concerns.  Additional Information:  Your vital signs today were: BP (!) 140/99 (BP Location: Left Arm)   Pulse (!) 116   Temp 100.2 F (37.9 C) (Oral)   Resp (!) 22   Wt 93.4 kg   SpO2  96%   BMI 29.56 kg/m  If your blood pressure (BP) was elevated above 135/85 this visit, please have this repeated by your doctor within one month.

## 2024-03-12 NOTE — ED Provider Notes (Signed)
 " [place] AT [facility] Provider Note   CSN: [id number] Arrival date & time: 03/12/24  9068     Patient presents with: Cough   Walter Richardson is a 43 y.o. male.   Patient seen on arrival by myself in triage.  Pt complains of worsening body aches cough, and shortness of breath.  Patient has a history of myasthenia gravis.  States that he has been dealing with a cough and sniffles over the past week or so.  He has been treating at home with Delsym and albuterol  inhaler.  Last night he started feeling a lot worse, prompting visit today.  No nausea, vomiting, diarrhea.       Prior to Admission medications  Medication Sig Start Date End Date Taking? Authorizing Provider  aspirin 325 MG tablet Take 1 tablet by mouth daily.    [provider]  hydrocortisone -pramoxine (ANALPRAM  HC) 2.5-1 % rectal cream Place 1 application rectally 3 (three) times daily. 07/26/16   Tharon Lenis, NP  ibuprofen  (ADVIL ) 800 MG tablet Take 1 tablet (800 mg total) by mouth every 8 (eight) hours as needed (fever and/or body aches). 01/22/19   Landy Honora CROME, PA-C  losartan (COZAAR) 100 MG tablet Take 1 tablet by mouth daily. 05/08/21   [provider]  losartan-hydrochlorothiazide (HYZAAR) 50-12.5 MG tablet Take 1 tablet by mouth daily. 09/20/19   [provider]  naproxen  (NAPROSYN ) 375 MG tablet Take 1 tablet (375 mg total) by mouth 2 (two) times daily. 09/26/20   Raspet, Erin K, PA-C  Omega-3 Fatty Acids (FISH OIL) 1000 MG CAPS Take by mouth.    [provider]  OVER THE COUNTER MEDICATION Take 1 tablet by mouth daily. OTC Weight loss pill    [provider]  promethazine  (PHENERGAN ) 25 MG tablet Take 1 tablet (25 mg total) by mouth every 6 (six) hours as needed for nausea. 04/18/12   Lynnea Charleston, MD  traMADol  (ULTRAM ) 50 MG tablet Take 1 tablet (50 mg total) by mouth every 6 (six) hours as needed. 07/26/16   Tharon Lenis, NP     Allergies: Patient has no known allergies.    Review of Systems  Updated Vital Signs BP (!) 141/106 (BP Location: Left Arm)   Pulse (!) 130   Temp 99.9 F (37.7 C) (Oral)   Resp 20   Wt 93.4 kg   SpO2 96%   BMI 29.56 kg/m   Physical Exam Vitals and nursing note reviewed.  Constitutional:      Appearance: He is well-developed.  HENT:     Head: Normocephalic and atraumatic.     Jaw: No trismus.     Right Ear: External ear normal.     Left Ear: External ear normal.     Nose: Congestion and rhinorrhea present. No mucosal edema.     Mouth/Throat:     Mouth: Mucous membranes are not dry.     Pharynx: Uvula midline. No oropharyngeal exudate, posterior oropharyngeal erythema or uvula swelling.     Tonsils: No tonsillar abscesses.  Eyes:     General:        Right eye: No discharge.        Left eye: No discharge.     Conjunctiva/sclera: Conjunctivae normal.  Cardiovascular:     Rate and Rhythm: Regular rhythm. Tachycardia present.     Heart sounds: Normal heart sounds.  Pulmonary:     Effort: Pulmonary effort is normal. No respiratory distress.  Breath sounds: Rhonchi present. No wheezing or rales.     Comments: Congested sounding cough.  Speaking in full sentences. Abdominal:     Palpations: Abdomen is soft.     Tenderness: There is no abdominal tenderness.  Musculoskeletal:     Cervical back: Normal range of motion and neck supple.  Skin:    General: Skin is warm and dry.  Neurological:     Mental Status: He is alert.     (all labs ordered are listed, but only abnormal results are displayed) Labs Reviewed - No data to display  EKG: None  Radiology: No results found.   Procedures   Medications Ordered in the ED  acetaminophen  (TYLENOL ) tablet 650 mg (has no administration in time range)   ED Course  Patient seen and examined. History obtained directly from patient.   Labs/EKG: None ordered.   Imaging: Ordered chest x-ray.    Medications/Fluids: Ordered: none  Most recent vital signs reviewed and are as follows: BP (!) 141/106 (BP Location: Left Arm)   Pulse (!) 130   Temp 99.9 F (37.7 C) (Oral)   Resp 20   Wt 93.4 kg   SpO2 96%   BMI 29.56 kg/m   Initial impression: Influenza-like illness, shortness of breath, cough  12:14 PM Reassessment performed. Patient appears stable, no respiratory distress.  Patient states that he feels really bad.  He states that his back hurts and he wants to go.  NT at bedside giving Tylenol .  RT at bedside performing NIF.  Remains tachycardic, fever low but higher.  Imaging personally visualized and interpreted including: No obvious infiltrate but awaiting radiology results.  Reviewed pertinent lab work and imaging with patient at bedside. Questions answered.   Most current vital signs reviewed and are as follows: BP (!) 140/99 (BP Location: Left Arm)   Pulse (!) 116   Temp 100.2 F (37.9 C) (Oral)   Resp (!) 22   Wt 93.4 kg   SpO2 96%   BMI 29.56 kg/m   Plan: Patient with flulike illness.  Per RT, NIF better than -45, which is acceptable.   12:21 PM Reassessment performed. Patient appears stable.  He is up in the room, walking around without any distress.  Offered Toradol  for body aches and he agrees.  We discussed Tamiflu  and he accepts this prescription.  Imaging personally visualized and interpreted including: Chest x-ray suggestive of trace right-sided pleural effusion but no infiltrate or pneumonia.  Reviewed pertinent lab work and imaging with patient at bedside. Questions answered.   Most current vital signs reviewed and are as follows: BP (!) 140/99 (BP Location: Left Arm)   Pulse (!) 116   Temp 100.2 F (37.9 C) (Oral)   Resp (!) 22   Wt 93.4 kg   SpO2 96%   BMI 29.56 kg/m   Plan: Discharge to home.   Prescriptions written for: Tessalon , Tamiflu   12:21 PM Patient discharged to home. Encouraged to rest and drink plenty of fluids.  Patient  told to return to ED or see their primary doctor if their symptoms worsen, high fever not controlled with tylenol , persistent vomiting, they feel they are dehydrated, or if they have any other concerns.  Patient verbalized understanding and agreed with plan.                                     Medical Decision Making Amount and/or Complexity of  Data Reviewed Radiology: ordered.  Risk OTC drugs.   Patient presents with flulike symptoms.  Clinically exam suggestive of influenza.  Patient is well-appearing.  Vital signs are stable, fevers controlled.  No clinical signs and symptoms of significant dehydration and is currently tolerating fluids.  Patient with history of myasthenia gravis, shortness of breath this morning.  NIF better than -45 per RT.  I do not suspect any impending respiratory failure.   Considered secondary infection such as pneumonia, however lungs are clear on exam and patient without hypoxia.  Chest x-ray with small right pleural effusion, but no infiltrate.  Patient does not appear fluid overloaded and does not have a history of heart failure.  Low concern for sepsis.  In addition, low clinical concern for mononucleosis.   Given well appearance, supportive therapy indicated currently.  Discussed signs and symptoms to return with patient at bedside.  Patient seems reliable to return as needed.       Final diagnoses:  Influenza-like illness  Shortness of breath    ED Discharge Orders          Ordered    oseltamivir  (TAMIFLU ) 75 MG capsule  Every 12 hours        03/12/24 1220    benzonatate  (TESSALON ) 100 MG capsule  Every 8 hours        03/12/24 1220               Desiderio Chew, PA-C 03/12/24 1223  "

## 2024-03-12 NOTE — ED Provider Triage Note (Signed)
 Emergency Medicine Provider Triage Evaluation Note  Walter Richardson , a 43 y.o. male  was evaluated in triage.  Pt complains of worsening body aches cough, and shortness of breath.  Patient has a history of myasthenia gravis.  States that he has been dealing with a cough and sniffles over the past week or so.  He has been treating at home with Delsym.  Last night he started feeling a lot worse, prompting visit today.  Review of Systems  Positive: Cough, shortness of breath Negative: Vomiting  Physical Exam  BP (!) 141/106 (BP Location: Left Arm)   Pulse (!) 130   Temp 99.9 F (37.7 C) (Oral)   Resp 20   Wt 93.4 kg   SpO2 96%   BMI 29.56 kg/m  Gen:   Awake, no distress   Resp:  Normal effort  MSK:   Moves extremities without difficulty  Other:  Congested cough during exam, speaking in full sentences  Medical Decision Making  Medically screening exam initiated at 9:38 AM.  Appropriate orders placed.  Walter Richardson was informed that the remainder of the evaluation will be completed by another provider, this initial triage assessment does not replace that evaluation, and the importance of remaining in the ED until their evaluation is complete.     Walter Chew, PA-C 03/12/24 [phone]

## 2024-03-12 NOTE — ED Triage Notes (Signed)
 Pt c/o body aches, shob worse when coughing starting last night. Pt speaking in complete sentences

## 2024-03-12 NOTE — ED Notes (Signed)
 NIF performed with great pt effort.  -45 with best effort.  PT does have severe side pain when taking in deep breaths.
